# Patient Record
Sex: Female | Born: 1950
Health system: Southern US, Community
[De-identification: ages and names within clinical notes are randomized; demographics above are authoritative.]

## PROBLEM LIST (undated history)

## (undated) DIAGNOSIS — E785 Hyperlipidemia, unspecified: Secondary | ICD-10-CM

## (undated) DIAGNOSIS — L740 Miliaria rubra: Secondary | ICD-10-CM

## (undated) HISTORY — DX: Hyperlipidemia, unspecified: E78.5

## (undated) HISTORY — PX: NO PAST SURGERIES: SHX2092

## (undated) HISTORY — DX: Miliaria rubra: L74.0

---

## 1999-10-25 ENCOUNTER — Emergency Department (HOSPITAL_COMMUNITY): Admission: EM | Admit: 1999-10-25 | Discharge: 1999-10-25 | Payer: Self-pay | Admitting: Emergency Medicine

## 1999-10-27 ENCOUNTER — Emergency Department (HOSPITAL_COMMUNITY): Admission: EM | Admit: 1999-10-27 | Discharge: 1999-10-27 | Payer: Self-pay | Admitting: Emergency Medicine

## 1999-11-15 ENCOUNTER — Encounter: Payer: Self-pay | Admitting: Family Medicine

## 1999-11-15 ENCOUNTER — Ambulatory Visit (HOSPITAL_COMMUNITY): Admission: RE | Admit: 1999-11-15 | Discharge: 1999-11-15 | Payer: Self-pay | Admitting: Family Medicine

## 2010-04-26 ENCOUNTER — Emergency Department (HOSPITAL_COMMUNITY): Admission: EM | Admit: 2010-04-26 | Discharge: 2010-04-26 | Payer: Self-pay | Admitting: Emergency Medicine

## 2010-10-10 LAB — URINALYSIS, ROUTINE W REFLEX MICROSCOPIC
Bilirubin Urine: NEGATIVE
Nitrite: NEGATIVE
Specific Gravity, Urine: 1.007 (ref 1.005–1.030)
pH: 6.5 (ref 5.0–8.0)

## 2010-10-10 LAB — DIFFERENTIAL
Basophils Absolute: 0 10*3/uL (ref 0.0–0.1)
Basophils Relative: 0 % (ref 0–1)
Eosinophils Absolute: 0 10*3/uL (ref 0.0–0.7)
Eosinophils Relative: 0 % (ref 0–5)
Lymphocytes Relative: 18 % (ref 12–46)
Lymphs Abs: 1 10*3/uL (ref 0.7–4.0)
Monocytes Absolute: 0.3 10*3/uL (ref 0.1–1.0)
Monocytes Relative: 4 % (ref 3–12)
Neutro Abs: 4.5 10*3/uL (ref 1.7–7.7)
Neutrophils Relative %: 77 % (ref 43–77)

## 2010-10-10 LAB — COMPREHENSIVE METABOLIC PANEL
ALT: 22 U/L (ref 0–35)
AST: 23 U/L (ref 0–37)
Albumin: 3.5 g/dL (ref 3.5–5.2)
Alkaline Phosphatase: 53 U/L (ref 39–117)
BUN: 11 mg/dL (ref 6–23)
CO2: 27 mEq/L (ref 19–32)
Calcium: 8.3 mg/dL — ABNORMAL LOW (ref 8.4–10.5)
Chloride: 106 mEq/L (ref 96–112)
Creatinine, Ser: 0.57 mg/dL (ref 0.4–1.2)
GFR calc Af Amer: >60 mL/min (ref >60)
GFR calc non Af Amer: >60 mL/min (ref >60)
Glucose, Bld: 94 mg/dL (ref 70–99)
Potassium: 3.9 mEq/L (ref 3.5–5.1)
Sodium: 137 mEq/L (ref 135–145)
Total Bilirubin: 0.8 mg/dL (ref 0.3–1.2)
Total Protein: 6.5 g/dL (ref 6.0–8.3)

## 2010-10-10 LAB — CBC
HCT: 32.2 % — ABNORMAL LOW (ref 36.0–46.0)
Hemoglobin: 10.9 g/dL — ABNORMAL LOW (ref 12.0–15.0)
MCH: 28.5 pg (ref 26.0–34.0)
MCHC: 34 g/dL (ref 30.0–36.0)
MCV: 83.7 fL (ref 78.0–100.0)
Platelets: 194 10*3/uL (ref 150–400)
RBC: 3.84 MIL/uL — ABNORMAL LOW (ref 3.87–5.11)
RDW: 13.4 % (ref 11.5–15.5)
WBC: 5.8 10*3/uL (ref 4.0–10.5)

## 2010-10-10 LAB — URINE CULTURE: Culture  Setup Time: 201109301026

## 2010-10-10 LAB — HEMOCCULT GUIAC POC 1CARD (OFFICE): Fecal Occult Bld: NEGATIVE

## 2010-10-10 LAB — URINE MICROSCOPIC-ADD ON

## 2010-10-10 LAB — LIPASE, BLOOD: Lipase: 29 U/L (ref 11–59)

## 2013-11-11 ENCOUNTER — Ambulatory Visit (INDEPENDENT_AMBULATORY_CARE_PROVIDER_SITE_OTHER): Payer: BC Managed Care – PPO | Admitting: Family Medicine

## 2013-11-11 VITALS — BP 160/88 | HR 92 | Temp 98.6°F | Resp 16 | Ht <= 58 in | Wt 125.0 lb

## 2013-11-11 DIAGNOSIS — Z0000 Encounter for general adult medical examination without abnormal findings: Secondary | ICD-10-CM

## 2013-11-11 DIAGNOSIS — Z1239 Encounter for other screening for malignant neoplasm of breast: Secondary | ICD-10-CM

## 2013-11-11 DIAGNOSIS — Z1211 Encounter for screening for malignant neoplasm of colon: Secondary | ICD-10-CM

## 2013-11-11 DIAGNOSIS — Z13220 Encounter for screening for lipoid disorders: Secondary | ICD-10-CM

## 2013-11-11 DIAGNOSIS — Z124 Encounter for screening for malignant neoplasm of cervix: Secondary | ICD-10-CM

## 2013-11-11 DIAGNOSIS — Z23 Encounter for immunization: Secondary | ICD-10-CM

## 2013-11-11 LAB — CBC
HCT: 38.4 % (ref 36.0–46.0)
Hemoglobin: 12.8 g/dL (ref 12.0–15.0)
MCH: 26.7 pg (ref 26.0–34.0)
MCHC: 33.3 g/dL (ref 30.0–36.0)
MCV: 80 fL (ref 78.0–100.0)
PLATELETS: 283 10*3/uL (ref 150–400)
RBC: 4.8 MIL/uL (ref 3.87–5.11)
RDW: 14.4 % (ref 11.5–15.5)
WBC: 10.7 10*3/uL — ABNORMAL HIGH (ref 4.0–10.5)

## 2013-11-11 LAB — COMPREHENSIVE METABOLIC PANEL
ALK PHOS: 95 U/L (ref 39–117)
ALT: 26 U/L (ref 0–35)
AST: 25 U/L (ref 0–37)
Albumin: 4.2 g/dL (ref 3.5–5.2)
BILIRUBIN TOTAL: 0.4 mg/dL (ref 0.2–1.2)
BUN: 10 mg/dL (ref 6–23)
CO2: 28 meq/L (ref 19–32)
CREATININE: 0.63 mg/dL (ref 0.50–1.10)
Calcium: 9.4 mg/dL (ref 8.4–10.5)
Chloride: 102 mEq/L (ref 96–112)
Glucose, Bld: 85 mg/dL (ref 70–99)
Potassium: 4.4 mEq/L (ref 3.5–5.3)
Sodium: 141 mEq/L (ref 135–145)
Total Protein: 7.2 g/dL (ref 6.0–8.3)

## 2013-11-11 LAB — LIPID PANEL
CHOL/HDL RATIO: 6.4 ratio
Cholesterol: 268 mg/dL — ABNORMAL HIGH (ref 0–200)
HDL: 42 mg/dL (ref >39)
LDL CALC: 175 mg/dL — AB (ref 0–99)
TRIGLYCERIDES: 256 mg/dL — AB (ref <150)
VLDL: 51 mg/dL — ABNORMAL HIGH (ref 0–40)

## 2013-11-11 LAB — TSH: TSH: 1.504 u[IU]/mL (ref 0.350–4.500)

## 2013-11-11 LAB — IFOBT (OCCULT BLOOD): IFOBT: NEGATIVE

## 2013-11-11 NOTE — Progress Notes (Signed)
Subjective:    Patient ID: Shannon [name], female    DOB: 05-Nov-1950, 63 y.o.   MRN: [id number]  HPI Patient presents today for a CPE. She is accompanied by her husband. They are from [place] and have lived in the [place] since 1981. The patient does not have a PCP and has not been seen for any medical problems in many years.   Last CPE- unknown Last Mammo- 2001 Last PAP- never  Colonoscopy- never Flu- not recently  PMH- patient denies, takes no medications PSH- none FH/SH- Patient married for 16 years and has 4 grown sons and 2 grandchildren. Her father is alive and well, aged 49. Her mother is deceased, patient unsure of age or cause. The patient is retired. She does not smoke, drink or use illicit drugs.  The patient rides an exercise bike daily and does yard work.  Review of Systems  Constitutional: Negative for fever, chills, appetite change and fatigue.  HENT: Negative for congestion, dental problem, ear pain, hearing loss, rhinorrhea and sore throat.   Eyes: Negative for pain and visual disturbance.  Respiratory: Negative for cough, chest tightness and shortness of breath.   Cardiovascular: Negative for chest pain, palpitations and leg swelling.  Gastrointestinal: Negative for nausea, vomiting, abdominal pain, diarrhea and constipation.  Genitourinary: Negative for dysuria, frequency, vaginal bleeding, vaginal discharge, difficulty urinating, vaginal pain and dyspareunia.  Musculoskeletal: Negative for back pain, neck pain and neck stiffness.  Neurological: Negative for dizziness, light-headedness and headaches.  Psychiatric/Behavioral: Negative for sleep disturbance. The patient is not nervous/anxious.        Objective:   Physical Exam  Vitals reviewed. Constitutional: She is oriented to person, place, and time. She appears well-developed and well-nourished. No distress.  HENT:  Head: Normocephalic and atraumatic.  Right Ear: Tympanic membrane, external ear and ear canal  normal.  Left Ear: Tympanic membrane, external ear and ear canal normal.  Nose: Nose normal.  Mouth/Throat: Uvula is midline, oropharynx is clear and moist and mucous membranes are normal. No oropharyngeal exudate.  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 These cover approximately 10 percent of her corneas. She reports these are common in her country and that she has had them for many years. She denies visual disturbance, difficulty with vision. ? Corneal scarring.  Eyes: Conjunctivae and EOM are normal. Pupils are equal, round, and reactive to light. Right eye exhibits no discharge. Left eye exhibits no discharge.    Neck: Normal range of motion. Neck supple. No thyromegaly present.  Cardiovascular: Normal rate, regular rhythm, normal heart sounds and intact distal pulses.   Pulmonary/Chest: Effort normal and breath sounds normal.  Abdominal: Soft. Bowel sounds are normal.  Genitourinary: Vagina normal and uterus normal. No breast swelling, tenderness, discharge or bleeding. Pelvic exam was performed with patient prone. There is no rash, tenderness, lesion or injury on the right labia. There is no rash, tenderness, lesion or injury on the left labia. No vaginal discharge found.  Musculoskeletal: Normal range of motion. She exhibits no edema and no tenderness.  Lymphadenopathy:    She has no cervical adenopathy.  Neurological: She is alert and oriented to person, place, and time. She has normal reflexes.  Skin: Skin is warm and dry. She is not diaphoretic.  Psychiatric: She has a normal mood and affect. Her behavior is normal. Judgment and thought content normal.       Assessment & Plan:  1. Breast cancer screening - MM Digital Screening; Future  2.  Cervical cancer screening - Pap IG and HPV (high risk) DNA detection  3. Colon cancer screening -Recommended colonoscopy, patient not interested at this time. Will think about it. - IFOBT POC (occult bld, rslt in  office)  4. Lipid screening - Lipid panel  5. Routine general medical examination at a health care facility - CBC - Comprehensive metabolic panel - TSH  Patient left prior to receiving Tdap. Will place a future order and ask her to return when she is notified of her lab results.  Provided written and verbal instructions regarding recommendations for opthalmologic and dental care. Encouraged patient to establish regular care at the [facility] appointment center.  Elby Beck, FNP-BC  [facility]  11/11/2013 11:35 AM   Patient discussed with Ms. Carlean Purl, NP and agree with the above.    Christell Faith, MHS, PA-C [facility], [place] [address] [place] 11/11/2013 2:08 PM

## 2013-11-11 NOTE — Patient Instructions (Addendum)
Please call [facility] to schedule your mammogram- [phone] Recommendations- Please see the eye doctor every year Please see the dentist every 6 months.  Consider having a colonoscopy to have your colon screened for colon cancer.   If you would like to establish care at the appointment center next door, call [phone] and ask to be transferred to the appointment center.

## 2013-11-15 LAB — PAP IG AND HPV HIGH-RISK: HPV DNA High Risk: NOT DETECTED

## 2013-11-17 ENCOUNTER — Encounter: Payer: Self-pay | Admitting: Family Medicine

## 2013-11-25 ENCOUNTER — Ambulatory Visit
Admission: RE | Admit: 2013-11-25 | Discharge: 2013-11-25 | Disposition: A | Discharge status: Home / Self Care | Payer: BC Managed Care – PPO | Source: Ambulatory Visit | Attending: Family Medicine | Admitting: Family Medicine

## 2013-11-25 DIAGNOSIS — Z1239 Encounter for other screening for malignant neoplasm of breast: Secondary | ICD-10-CM

## 2014-05-12 ENCOUNTER — Ambulatory Visit (INDEPENDENT_AMBULATORY_CARE_PROVIDER_SITE_OTHER): Payer: BC Managed Care – PPO | Admitting: Family Medicine

## 2014-05-12 VITALS — BP 142/78 | HR 94 | Temp 98.3°F | Resp 16 | Ht <= 58 in | Wt 119.0 lb

## 2014-05-12 DIAGNOSIS — Z23 Encounter for immunization: Secondary | ICD-10-CM

## 2014-05-12 DIAGNOSIS — L259 Unspecified contact dermatitis, unspecified cause: Secondary | ICD-10-CM

## 2014-05-12 MED ORDER — CLOBETASOL PROPIONATE 0.05 % EX CREA: 1.0000 "application " | TOPICAL_CREAM | Freq: Two times a day (BID) | CUTANEOUS | Status: DC

## 2014-05-12 NOTE — Patient Instructions (Addendum)
Use a small amount of cream twice a day for up to 14 days If your rash gets worse, come back in to see [place]. If it is not better in 7-10 days, come back in to see [place]. Contact Dermatitis Contact dermatitis is a reaction to certain substances that touch the skin. Contact dermatitis can be either irritant contact dermatitis or allergic contact dermatitis. Irritant contact dermatitis does not require previous exposure to the substance for a reaction to occur.Allergic contact dermatitis only occurs if you have been exposed to the substance before. Upon a repeat exposure, your body reacts to the substance.  CAUSES  Many substances can cause contact dermatitis. Irritant dermatitis is most commonly caused by repeated exposure to mildly irritating substances, such as:  Makeup.  Soaps.  Detergents.  Bleaches.  Acids.  Metal salts, such as nickel. Allergic contact dermatitis is most commonly caused by exposure to:  Poisonous plants.  Chemicals (deodorants, shampoos).  Jewelry.  Latex.  Neomycin in triple antibiotic cream.  Preservatives in products, including clothing. SYMPTOMS  The area of skin that is exposed may develop:  Dryness or flaking.  Redness.  Cracks.  Itching.  Pain or a burning sensation.  Blisters. With allergic contact dermatitis, there may also be swelling in areas such as the eyelids, mouth, or genitals.  DIAGNOSIS  Your caregiver can usually tell what the problem is by doing a physical exam. In cases where the cause is uncertain and an allergic contact dermatitis is suspected, a patch skin test may be performed to help determine the cause of your dermatitis. TREATMENT Treatment includes protecting the skin from further contact with the irritating substance by avoiding that substance if possible. Barrier creams, powders, and gloves may be helpful. Your caregiver may also recommend:  Steroid creams or ointments applied 2 times daily. For best results, soak the  rash area in cool water for 20 minutes. Then apply the medicine. Cover the area with a plastic wrap. You can store the steroid cream in the refrigerator for a "chilly" effect on your rash. That may decrease itching. Oral steroid medicines may be needed in more severe cases.  Antibiotics or antibacterial ointments if a skin infection is present.  Antihistamine lotion or an antihistamine taken by mouth to ease itching.  Lubricants to keep moisture in your skin.  Burow's solution to reduce redness and soreness or to dry a weeping rash. Mix one packet or tablet of solution in 2 cups cool water. Dip a clean washcloth in the mixture, wring it out a bit, and put it on the affected area. Leave the cloth in place for 30 minutes. Do this as often as possible throughout the day.  Taking several cornstarch or baking soda baths daily if the area is too large to cover with a washcloth. Harsh chemicals, such as alkalis or acids, can cause skin damage that is like a burn. You should flush your skin for 15 to 20 minutes with cold water after such an exposure. You should also seek immediate medical care after exposure. Bandages (dressings), antibiotics, and pain medicine may be needed for severely irritated skin.  HOME CARE INSTRUCTIONS  Avoid the substance that caused your reaction.  Keep the area of skin that is affected away from hot water, soap, sunlight, chemicals, acidic substances, or anything else that would irritate your skin.  Do not scratch the rash. Scratching may cause the rash to become infected.  You may take cool baths to help stop the itching.  Only take  over-the-counter or prescription medicines as directed by your caregiver.  See your caregiver for follow-up care as directed to make sure your skin is healing properly. SEEK MEDICAL CARE IF:   Your condition is not better after 3 days of treatment.  You seem to be getting worse.  You see signs of infection such as swelling, tenderness,  redness, soreness, or warmth in the affected area.  You have any problems related to your medicines. Document Released: 07/11/2000 Document Revised: 10/06/2011 Document Reviewed: 12/17/2010 [facility] Patient Information 2015 [place], [place]. This information is not intended to replace advice given to you by your health care provider. Make sure you discuss any questions you have with your health care provider.  Tdap Vaccine (Tetanus, Diphtheria, Pertussis): What You Need to Know 1. Why get vaccinated? Tetanus, diphtheria and pertussis can be very serious diseases, even for adolescents and adults. Tdap vaccine can protect [place] from these diseases. TETANUS (Lockjaw) causes painful muscle tightening and stiffness, usually all over the body.  It can lead to tightening of muscles in the head and neck so you can't open your mouth, swallow, or sometimes even breathe. Tetanus kills about 1 out of 5 people who are infected. DIPHTHERIA can cause a thick coating to form in the back of the throat.  It can lead to breathing problems, paralysis, heart failure, and death. PERTUSSIS (Whooping Cough) causes severe coughing spells, which can cause difficulty breathing, vomiting and disturbed sleep.  It can also lead to weight loss, incontinence, and rib fractures. Up to 2 in 100 adolescents and 5 in 100 adults with pertussis are hospitalized or have complications, which could include pneumonia or death. These diseases are caused by bacteria. Diphtheria and pertussis are spread from person to person through coughing or sneezing. Tetanus enters the body through cuts, scratches, or wounds. Before vaccines, the [place] saw as many as 200,000 cases a year of diphtheria and pertussis, and hundreds of cases of tetanus. Since vaccination began, tetanus and diphtheria have dropped by about 99% and pertussis by about 80%. 2. Tdap vaccine Tdap vaccine can protect adolescents and adults from tetanus, diphtheria, and  pertussis. One dose of Tdap is routinely given at age 57 or 64. People who did not get Tdap at that age should get it as soon as possible. Tdap is especially important for health care professionals and anyone having close contact with a baby younger than 12 months. Pregnant women should get a dose of Tdap during every pregnancy, to protect the newborn from pertussis. Infants are most at risk for severe, life-threatening complications from pertussis. A similar vaccine, called Td, protects from tetanus and diphtheria, but not pertussis. A Td booster should be given every 10 years. Tdap may be given as one of these boosters if you have not already gotten a dose. Tdap may also be given after a severe cut or burn to prevent tetanus infection. Your doctor can give you more information. Tdap may safely be given at the same time as other vaccines. 3. Some people should not get this vaccine  If you ever had a life-threatening allergic reaction after a dose of any tetanus, diphtheria, or pertussis containing vaccine, OR if you have a severe allergy to any part of this vaccine, you should not get Tdap. Tell your doctor if you have any severe allergies.  If you had a coma, or long or multiple seizures within 7 days after a childhood dose of DTP or DTaP, you should not get Tdap, unless a  cause other than the vaccine was found. You can still get Td.  Talk to your doctor if you:  have epilepsy or another nervous system problem,  had severe pain or swelling after any vaccine containing diphtheria, tetanus or pertussis,  ever had Guillain-Barr Syndrome (GBS),  aren't feeling well on the day the shot is scheduled. 4. Risks of a vaccine reaction With any medicine, including vaccines, there is a chance of side effects. These are usually mild and go away on their own, but serious reactions are also possible. Brief fainting spells can follow a vaccination, leading to injuries from falling. Sitting or lying down  for about 15 minutes can help prevent these. Tell your doctor if you feel dizzy or light-headed, or have vision changes or ringing in the ears. Mild problems following Tdap (Did not interfere with activities)  Pain where the shot was given (about 3 in 4 adolescents or 2 in 3 adults)  Redness or swelling where the shot was given (about 1 person in 5)  Mild fever of at least 100.57F (up to about 1 in 25 adolescents or 1 in 100 adults)  Headache (about 3 or 4 people in 10)  Tiredness (about 1 person in 3 or 4)  Nausea, vomiting, diarrhea, stomach ache (up to 1 in 4 adolescents or 1 in 10 adults)  Chills, body aches, sore joints, rash, swollen glands (uncommon) Moderate problems following Tdap (Interfered with activities, but did not require medical attention)  Pain where the shot was given (about 1 in 5 adolescents or 1 in 100 adults)  Redness or swelling where the shot was given (up to about 1 in 16 adolescents or 1 in 25 adults)  Fever over 102F (about 1 in 100 adolescents or 1 in 250 adults)  Headache (about 3 in 20 adolescents or 1 in 10 adults)  Nausea, vomiting, diarrhea, stomach ache (up to 1 or 3 people in 100)  Swelling of the entire arm where the shot was given (up to about 3 in 100). Severe problems following Tdap (Unable to perform usual activities; required medical attention)  Swelling, severe pain, bleeding and redness in the arm where the shot was given (rare). A severe allergic reaction could occur after any vaccine (estimated less than 1 in a million doses). 5. What if there is a serious reaction? What should I look for?  Look for anything that concerns you, such as signs of a severe allergic reaction, very high fever, or behavior changes. Signs of a severe allergic reaction can include hives, swelling of the face and throat, difficulty breathing, a fast heartbeat, dizziness, and weakness. These would start a few minutes to a few hours after the  vaccination. What should I do?  If you think it is a severe allergic reaction or other emergency that can't wait, call 9-1-1 or get the person to the nearest hospital. Otherwise, call your doctor.  Afterward, the reaction should be reported to the "Vaccine Adverse Event Reporting System" (VAERS). Your doctor might file this report, or you can do it yourself through the VAERS web site at www.vaers.SamedayNews.es, or by calling 928-679-5251. VAERS is only for reporting reactions. They do not give medical advice.  6. The National Vaccine Injury Compensation Program The [employer] Vaccine Injury Compensation Program (VICP) is a federal program that was created to compensate people who may have been injured by certain vaccines. Persons who believe they may have been injured by a vaccine can learn about the program and about filing a  claim by calling 720-157-5613 or visiting the [facility] website at GoldCloset.com.ee. 7. How can I learn more?  Ask your doctor.  Call your local or state health department.  Contact the Centers for Disease Control and Prevention (CDC):  Call (248)336-7758 or visit CDC's website at http://hunter.com/. CDC Tdap Vaccine VIS (12/04/11) Document Released: 01/13/2012 Document Revised: 11/28/2013 Document Reviewed: 10/26/2013 [employer] Patient Information 2015 [place], [place]. This information is not intended to replace advice given to you by your health care provider. Make sure you discuss any questions you have with your health care provider. Influenza Vaccine (Flu Vaccine, Inactivated or Recombinant) 2014-2015: What You Need to Know 1. Why get vaccinated? Influenza ("flu") is a contagious disease that spreads around the [place] every winter, usually between October and May. Flu is caused by influenza viruses, and is spread mainly by coughing, sneezing, and close contact. Anyone can get flu, but the risk of getting flu is highest among children. Symptoms come  on suddenly and may last several days. They can include:  fever/chills  sore throat  muscle aches  fatigue  cough  headache  runny or stuffy nose Flu can make some people much sicker than others. These people include young children, people 40 and older, pregnant women, and people with certain health conditions-such as heart, lung or kidney disease, nervous system disorders, or a weakened immune system. Flu vaccination is especially important for these people, and anyone in close contact with them. Flu can also lead to pneumonia, and make existing medical conditions worse. It can cause diarrhea and seizures in children. Each year thousands of people in the [place] die from flu, and many more are hospitalized. Flu vaccine is the best protection against flu and its complications. Flu vaccine also helps prevent spreading flu from person to person. 2. Inactivated and recombinant flu vaccines You are getting an injectable flu vaccine, which is either an "inactivated" or "recombinant" vaccine. These vaccines do not contain any live influenza virus. They are given by injection with a needle, and often called the "flu shot."  A different live, attenuated (weakened) influenza vaccine is sprayed into the nostrils. This vaccine is described in a separate Vaccine Information Statement. Flu vaccination is recommended every year. Some children 6 months through 9 years of age might need two doses during one year. Flu viruses are always changing. Each year's flu vaccine is made to protect against 3 or 4 viruses that are likely to cause disease that year. Flu vaccine cannot prevent all cases of flu, but it is the best defense against the disease.  It takes about 2 weeks for protection to develop after the vaccination, and protection lasts several months to a year. Some illnesses that are not caused by influenza virus are often mistaken for flu. Flu vaccine will not prevent these illnesses. It can only  prevent influenza. Some inactivated flu vaccine contains a very small amount of a mercury-based preservative called thimerosal. Studies have shown that thimerosal in vaccines is not harmful, but flu vaccines that do not contain a preservative are available. 3. Some people should not get this vaccine Tell the person who gives you the vaccine:  If you have any severe, life-threatening allergies. If you ever had a life-threatening allergic reaction after a dose of flu vaccine, or have a severe allergy to any part of this vaccine, including (for example) an allergy to gelatin, antibiotics, or eggs, you may be advised not to get vaccinated. Most, but not all, types of flu vaccine  contain a small amount of egg protein.  If you ever had Guillain-Barr Syndrome (a severe paralyzing illness, also called GBS). Some people with a history of GBS should not get this vaccine. This should be discussed with your doctor.  If you are not feeling well. It is usually okay to get flu vaccine when you have a mild illness, but you might be advised to wait until you feel better. You should come back when you are better. 4. Risks of a vaccine reaction With a vaccine, like any medicine, there is a chance of side effects. These are usually mild and go away on their own. Problems that could happen after any vaccine:  Brief fainting spells can happen after any medical procedure, including vaccination. Sitting or lying down for about 15 minutes can help prevent fainting, and injuries caused by a fall. Tell your doctor if you feel dizzy, or have vision changes or ringing in the ears.  Severe shoulder pain and reduced range of motion in the arm where a shot was given can happen, very rarely, after a vaccination.  Severe allergic reactions from a vaccine are very rare, estimated at less than 1 in a million doses. If one were to occur, it would usually be within a few minutes to a few hours after the vaccination. Mild problems  following inactivated flu vaccine:  soreness, redness, or swelling where the shot was given  hoarseness  sore, red or itchy eyes  cough  fever  aches  headache  itching  fatigue If these problems occur, they usually begin soon after the shot and last 1 or 2 days. Moderate problems following inactivated flu vaccine:  Young children who get inactivated flu vaccine and pneumococcal vaccine (PCV13) at the same time may be at increased risk for seizures caused by fever. Ask your doctor for more information. Tell your doctor if a child who is getting flu vaccine has ever had a seizure. Inactivated flu vaccine does not contain live flu virus, so you cannot get the flu from this vaccine. As with any medicine, there is a very remote chance of a vaccine causing a serious injury or death. The safety of vaccines is always being monitored. For more information, visit: http://www.aguilar.org/ 5. What if there is a serious reaction? What should I look for?  Look for anything that concerns you, such as signs of a severe allergic reaction, very high fever, or behavior changes. Signs of a severe allergic reaction can include hives, swelling of the face and throat, difficulty breathing, a fast heartbeat, dizziness, and weakness. These would start a few minutes to a few hours after the vaccination. What should I do?  If you think it is a severe allergic reaction or other emergency that can't wait, call 9-1-1 and get the person to the nearest hospital. Otherwise, call your doctor.  Afterward, the reaction should be reported to the Vaccine Adverse Event Reporting System (VAERS). Your doctor should file this report, or you can do it yourself through the VAERS web site at www.vaers.SamedayNews.es, or by calling 515-001-1161. VAERS does not give medical advice. 6. The National Vaccine Injury Compensation Program The [employer] Vaccine Injury Compensation Program (VICP) is a federal program that was created  to compensate people who may have been injured by certain vaccines. Persons who believe they may have been injured by a vaccine can learn about the program and about filing a claim by calling (437)446-7382 or visiting the [place] website at GoldCloset.com.ee. There is a time limit  to file a claim for compensation. 7. How can I learn more?  Ask your health care provider.  Call your local or state health department.  Contact the Centers for Disease Control and Prevention (CDC):  Call (248)477-4064 (1-800-CDC-INFO) or  Visit CDC's website at https://gibson.com/ CDC Vaccine Information Statement (Interim) Inactivated Influenza Vaccine (03/15/2013) Document Released: 05/08/2006 Document Revised: 11/28/2013 Document Reviewed: 07/01/2013 [facility] Patient Information 2015 [place]. This information is not intended to replace advice given to you by your health care provider. Make sure you discuss any questions you have with your health care provider.

## 2014-05-12 NOTE — Progress Notes (Signed)
   Subjective:    Patient ID: Shannon [name], female    DOB: Oct 30, 1950, 63 y.o.   MRN: [id number]  HPI  This is a very pleasant 63 yo female who is accompanied by her husband. She presents today with a rash between her fingers. She was removing her tomato plants from her garden about 2 weeks ago when she became irritated between her 2-3rd, 3-4th fingers on both hands. The areas became red and itchy initially. She applied some OTC cream for itching (not sure what this was) with relief of itching. She has some occasional clear drainage. The rash has stayed the same over the last week.    Review of Systems No fever or chills, no pain.     Objective:   Physical Exam  Vitals reviewed. Constitutional: She is oriented to person, place, and time. She appears well-developed and well-nourished.  HENT:  Head: Normocephalic and atraumatic.  Neck: Normal range of motion. Neck supple.  Cardiovascular: Normal rate.   Pulmonary/Chest: Effort normal.  Musculoskeletal: Normal range of motion.  Neurological: She is alert and oriented to person, place, and time.  Skin: Skin is warm and dry. Rash noted.  Area between fingers (2-3, 3-4) bilaterally with peeling, flaking, redness. Evidence of ointment. No purulence. No streaking, no extension to dorsum or palm.       Assessment & Plan:  1. Contact dermatitis -Provided written and verbal information regarding diagnosis and treatment. -patient instructions- Use a small amount of cream twice a day for up to 14 days If your rash gets worse, come back in to see [place]. If it is not better in 7-10 days, come back in to see [place]. - clobetasol cream (TEMOVATE) 0.05 %; Apply 1 application topically 2 (two) times daily.  Dispense: 30 g; Refill: 0  2. Flu vaccine need - Flu Vaccine QUAD 36+ mos IM  3. Need for Tdap vaccination - Tdap vaccine greater than or equal to 7yo IM   Elby Beck, FNP-BC  [facility] [place]  [facility]  05/12/2014 10:03 AM

## 2015-01-19 ENCOUNTER — Other Ambulatory Visit: Payer: Self-pay | Admitting: Family Medicine

## 2015-01-19 DIAGNOSIS — Z1231 Encounter for screening mammogram for malignant neoplasm of breast: Secondary | ICD-10-CM

## 2015-02-02 ENCOUNTER — Ambulatory Visit (HOSPITAL_COMMUNITY)
Admission: RE | Admit: 2015-02-02 | Discharge: 2015-02-02 | Disposition: A | Discharge status: Home / Self Care | Payer: Commercial Managed Care - HMO | Source: Ambulatory Visit | Attending: Family Medicine | Admitting: Family Medicine

## 2015-02-02 DIAGNOSIS — Z1231 Encounter for screening mammogram for malignant neoplasm of breast: Secondary | ICD-10-CM | POA: Insufficient documentation

## 2015-02-05 ENCOUNTER — Other Ambulatory Visit: Payer: Self-pay | Admitting: Family Medicine

## 2015-02-05 DIAGNOSIS — R928 Other abnormal and inconclusive findings on diagnostic imaging of breast: Secondary | ICD-10-CM

## 2015-03-09 ENCOUNTER — Ambulatory Visit
Admission: RE | Admit: 2015-03-09 | Discharge: 2015-03-09 | Disposition: A | Discharge status: Home / Self Care | Payer: Commercial Managed Care - HMO | Source: Ambulatory Visit | Attending: Family Medicine | Admitting: Family Medicine

## 2015-03-09 DIAGNOSIS — R921 Mammographic calcification found on diagnostic imaging of breast: Secondary | ICD-10-CM | POA: Diagnosis not present

## 2015-03-09 DIAGNOSIS — R928 Other abnormal and inconclusive findings on diagnostic imaging of breast: Secondary | ICD-10-CM

## 2015-11-06 ENCOUNTER — Encounter: Payer: Self-pay | Admitting: Family Medicine

## 2015-11-06 ENCOUNTER — Ambulatory Visit (INDEPENDENT_AMBULATORY_CARE_PROVIDER_SITE_OTHER): Payer: Medicare HMO | Admitting: Family Medicine

## 2015-11-06 VITALS — BP 125/79 | HR 85 | Temp 98.3°F | Resp 16 | Ht <= 58 in | Wt 121.0 lb

## 2015-11-06 DIAGNOSIS — Z23 Encounter for immunization: Secondary | ICD-10-CM | POA: Diagnosis not present

## 2015-11-06 DIAGNOSIS — Z1389 Encounter for screening for other disorder: Secondary | ICD-10-CM

## 2015-11-06 DIAGNOSIS — Z1211 Encounter for screening for malignant neoplasm of colon: Secondary | ICD-10-CM | POA: Diagnosis not present

## 2015-11-06 DIAGNOSIS — Z0000 Encounter for general adult medical examination without abnormal findings: Secondary | ICD-10-CM

## 2015-11-06 DIAGNOSIS — Z13220 Encounter for screening for lipoid disorders: Secondary | ICD-10-CM

## 2015-11-06 DIAGNOSIS — Z130 Encounter for screening for diseases of the blood and blood-forming organs and certain disorders involving the immune mechanism: Secondary | ICD-10-CM

## 2015-11-06 DIAGNOSIS — Z139 Encounter for screening, unspecified: Secondary | ICD-10-CM

## 2015-11-06 LAB — CBC
HEMATOCRIT: 37.4 % (ref 35.0–45.0)
Hemoglobin: 12.4 g/dL (ref 11.7–15.5)
MCH: 27.2 pg (ref 27.0–33.0)
MCHC: 33.2 g/dL (ref 32.0–36.0)
MCV: 82 fL (ref 80.0–100.0)
MPV: 10.9 fL (ref 7.5–12.5)
PLATELETS: 255 10*3/uL (ref 140–400)
RBC: 4.56 MIL/uL (ref 3.80–5.10)
RDW: 14.2 % (ref 11.0–15.0)
WBC: 9.2 10*3/uL (ref 3.8–10.8)

## 2015-11-06 LAB — COMPLETE METABOLIC PANEL WITH GFR
ALK PHOS: 62 U/L (ref 33–130)
ALT: 13 U/L (ref 6–29)
AST: 22 U/L (ref 10–35)
Albumin: 4.2 g/dL (ref 3.6–5.1)
BILIRUBIN TOTAL: 0.5 mg/dL (ref 0.2–1.2)
BUN: 8 mg/dL (ref 7–25)
CO2: 26 mmol/L (ref 20–31)
Calcium: 9.2 mg/dL (ref 8.6–10.4)
Chloride: 103 mmol/L (ref 98–110)
Creat: 0.6 mg/dL (ref 0.50–0.99)
GFR, Est Non African American: >89 mL/min (ref >=60)
Glucose, Bld: 85 mg/dL (ref 65–99)
Potassium: 4 mmol/L (ref 3.5–5.3)
Sodium: 140 mmol/L (ref 135–146)
TOTAL PROTEIN: 7.3 g/dL (ref 6.1–8.1)

## 2015-11-06 LAB — LIPID PANEL
CHOL/HDL RATIO: 5.3 ratio — AB (ref <=5.0)
CHOLESTEROL: 261 mg/dL — AB (ref 125–200)
HDL: 49 mg/dL (ref >=46)
LDL Cholesterol: 164 mg/dL — ABNORMAL HIGH (ref <130)
TRIGLYCERIDES: 241 mg/dL — AB (ref <150)
VLDL: 48 mg/dL — ABNORMAL HIGH (ref <30)

## 2015-11-06 NOTE — Patient Instructions (Addendum)
We will call you about an appointment for screening colonoscopy  Keep up the good work with your exercise and healthy food choices   Keeping You Healthy  Get These Tests  Blood Pressure- Have your blood pressure checked by your healthcare provider at least once a year.  Normal blood pressure is 120/80.  Weight- Have your body mass index (BMI) calculated to screen for obesity.  BMI is a measure of body fat based on height and weight.  You can calculate your own BMI at [employer]velB[employer].it  Cholesterol- Have your cholesterol checked every year.  Diabetes- Have your blood sugar checked every year if you have high blood pressure, high cholesterol, a family history of diabetes or if you are overweight.  Pap Test - Have a pap test every 1 to 5 years if you have been sexually active.  If you are older than 65 and recent pap tests have been normal you may not need additional pap tests.  In addition, if you have had a hysterectomy  for benign disease additional pap tests are not necessary.  Mammogram-Yearly mammograms are essential for early detection of breast cancer  Screening for Colon Cancer- Colonoscopy starting at age 66. Screening may begin sooner depending on your family history and other health conditions.  Follow up colonoscopy as directed by your Gastroenterologist.  Screening for Osteoporosis- Screening begins at age 66 with bone density scanning, sooner if you are at higher risk for developing Osteoporosis.  Get these medicines  Calcium with Vitamin D- Your body requires 1200-1500 mg of Calcium a day and [phone] IU of Vitamin D a day.  You can only absorb 500 mg of Calcium at a time therefore Calcium must be taken in 2 or 3 separate doses throughout the day.  Hormones- Hormone therapy has been associated with increased risk for certain cancers and heart disease.  Talk to your healthcare provider about if you need relief from menopausal symptoms.  Aspirin- Ask your healthcare  provider about taking Aspirin to prevent Heart Disease and Stroke.  Get these Immuniztions  Flu shot- Every fall  Pneumonia shot- Once after the age of 23; if you are younger ask your healthcare provider if you need a pneumonia shot.  Tetanus- Every ten years.  Zostavax- Once after the age of 43 to prevent shingles.  Take these steps  Don't smoke- Your healthcare provider can help you quit. For tips on how to quit, ask your healthcare provider or go to www.smokefree.gov or call 1-800 QUIT-NOW.  Be physically active- Exercise 5 days a week for a minimum of 30 minutes.  If you are not already physically active, start slow and gradually work up to 30 minutes of moderate physical activity.  Try walking, dancing, bike riding, swimming, etc.  Eat a healthy diet- Eat a variety of healthy foods such as fruits, vegetables, whole grains, low fat milk, low fat cheeses, yogurt, lean meats, chicken, fish, eggs, dried beans, tofu, etc.  For more information go to www.thenutritionsource.org  Dental visit- Brush and floss teeth twice daily; visit your dentist twice a year.  Eye exam- Visit your Optometrist or Ophthalmologist yearly.  Drink alcohol in moderation- Limit alcohol intake to one drink or less a day.  Never drink and drive.  Depression- Your emotional health is as important as your physical health.  If you're feeling down or losing interest in things you normally enjoy, please talk to your healthcare provider.  Seat Belts- can save your life; always wear one  Smoke/Carbon Monoxide  detectors- These detectors need to be installed on the appropriate level of your home.  Replace batteries at least once a year.  Violence- If anyone is threatening or hurting you, please tell your healthcare provider.  Living Will/ Health care power of attorney- Discuss with your healthcare provider and family.    IF you received an x-ray today, you will receive an invoice from [facility]. Please  contact [facility] at [phone] with questions or concerns regarding your invoice.   IF you received labwork today, you will receive an invoice from [employer]. Please contact [facility] at [phone] with questions or concerns regarding your invoice.   Our billing staff will not be able to assist you with questions regarding bills from these companies.  You will be contacted with the lab results as soon as they are available. The fastest way to get your results is to activate your My Chart account. Instructions are located on the last page of this paperwork. If you have not heard from [place] regarding the results in 2 weeks, please contact this office.

## 2015-11-06 NOTE — Progress Notes (Signed)
Subjective:    Patient ID: Shannon [name], female    DOB: [birth year], 65 y.o.   MRN: [id number]  HPI This is a pleasant 65 yo female who presents today for CPE. She is accompanied by her husband. She has 3 sons. She is retired.   Last CPE- several years ago Mammo- 03/09/15 Pap- 11/11/13- nml Colonoscopy- never, willing to schedule Tdap- 04/2014 Flu- annual Eye- not for several years Dental- not for several years Exercise- goes to gym 3x week, gardens  No past medical history on file. No past surgical history on file. No family history on file. Social History  Substance Use Topics  . Smoking status: Never Smoker   . Smokeless tobacco: None  . Alcohol Use: No    Review of Systems  Constitutional: Negative.   HENT: Negative.   Eyes: Negative.   Respiratory: Negative.   Cardiovascular: Negative.   Gastrointestinal: Negative.   Endocrine: Negative.   Genitourinary: Negative.   Musculoskeletal: Negative.   Skin: Negative.   Allergic/Immunologic: Negative.   Neurological: Negative.   Hematological: Negative.   Psychiatric/Behavioral: Negative.        Objective:   Physical Exam Physical Exam  Constitutional: She is oriented to person, place, and time. She appears well-developed and well-nourished. No distress.  HENT:  Head: Normocephalic and atraumatic.  Right Ear: External ear normal.  Left Ear: External ear normal.  Nose: Nose normal.  Mouth/Throat: Oropharynx is clear and moist. No oropharyngeal exudate.  Eyes: Conjunctivae are normal. Pupils are equal, round, and reactive to light.  Neck: Normal range of motion. Neck supple. No JVD present. No thyromegaly present.  Cardiovascular: Normal rate, regular rhythm, normal heart sounds and intact distal pulses.   Pulmonary/Chest: Effort normal and breath sounds normal. Right breast exhibits no inverted nipple, no mass, no nipple discharge, no skin change and no tenderness. Left breast exhibits no inverted nipple, no  mass, no nipple discharge, no skin change and no tenderness. Breasts are symmetrical.  Abdominal: Soft. Bowel sounds are normal. She exhibits no distension and no mass. There is no tenderness. There is no rebound and no guarding.  Musculoskeletal: Normal range of motion. She exhibits no edema or tenderness.  Lymphadenopathy:    She has no cervical adenopathy.  Neurological: She is alert and oriented to person, place, and time. She has normal reflexes.  Skin: Skin is warm and dry. She is not diaphoretic.  Psychiatric: She has a normal mood and affect. Her behavior is normal. Judgment and thought content normal.  Vitals reviewed. BP 125/79 mmHg  Pulse 85  Temp(Src) 98.3 F (36.8 C)  Resp 16  Ht 4\' 9"  (1.448 m)  Wt 121 lb (54.885 kg)  BMI 26.18 kg/m2 Wt Readings from Last 3 Encounters:  11/06/15 121 lb (54.885 kg)  05/12/14 119 lb (53.978 kg)  11/11/13 125 lb (56.7 kg)       Assessment & Plan:  1. Annual physical exam -- Discussed and encouraged healthy lifestyle choices- adequate sleep, regular exercise, stress management and healthy food choices.   2. Screening for deficiency anemia - CBC  3. Screening for nephropathy - COMPLETE METABOLIC PANEL WITH GFR  4. Screening for lipid disorders - Lipid panel  5. Screening for hematuria or proteinuria - POCT urinalysis dipstick  6. Special screening for malignant neoplasms, colon - Ambulatory referral to Gastroenterology  7. Need for vaccination with 13-polyvalent pneumococcal conjugate vaccine - Pneumococcal conjugate vaccine 13-valent IM   Clarene Reamer, FNP-BC  [facility]  [place] [facility]  11/07/2015 9:40 PM

## 2015-11-11 ENCOUNTER — Encounter: Payer: Self-pay | Admitting: Family Medicine

## 2016-02-08 ENCOUNTER — Ambulatory Visit (INDEPENDENT_AMBULATORY_CARE_PROVIDER_SITE_OTHER): Payer: Medicare HMO | Admitting: Physician Assistant

## 2016-02-08 VITALS — BP 124/80 | HR 98 | Temp 98.8°F | Resp 18 | Ht <= 58 in | Wt 121.0 lb

## 2016-02-08 DIAGNOSIS — L259 Unspecified contact dermatitis, unspecified cause: Secondary | ICD-10-CM | POA: Diagnosis not present

## 2016-02-08 MED ORDER — TRIAMCINOLONE ACETONIDE 0.5 % EX OINT: 1.0000 "application " | TOPICAL_OINTMENT | Freq: Two times a day (BID) | CUTANEOUS | Status: DC

## 2016-02-08 NOTE — Patient Instructions (Addendum)
Please apply the steroid topical twice daily for 2 weeks. If this doesn't improve the rash or if it gets worse please come back to see Shannon [name] for further evaluation.   Contact Dermatitis Dermatitis is redness, soreness, and swelling (inflammation) of the skin. Contact dermatitis is a reaction to certain substances that touch the skin. There are two types of contact dermatitis:   Irritant contact dermatitis. This type is caused by something that irritates your skin, such as dry hands from washing them too much. This type does not require previous exposure to the substance for a reaction to occur. This type is more common.  Allergic contact dermatitis. This type is caused by a substance that you are allergic to, such as a nickel allergy or poison ivy. This type only occurs if you have been exposed to the substance (allergen) before. Upon a repeat exposure, your body reacts to the substance. This type is less common. CAUSES  Many different substances can cause contact dermatitis. Irritant contact dermatitis is most commonly caused by exposure to:   Makeup.   Soaps.   Detergents.   Bleaches.   Acids.   Metal salts, such as nickel.  Allergic contact dermatitis is most commonly caused by exposure to:   Poisonous plants.   Chemicals.   Jewelry.   Latex.   Medicines.   Preservatives in products, such as clothing.  RISK FACTORS This condition is more likely to develop in:   People who have jobs that expose them to irritants or allergens.  People who have certain medical conditions, such as asthma or eczema.  SYMPTOMS  Symptoms of this condition may occur anywhere on your body where the irritant has touched you or is touched by you. Symptoms include:  Dryness or flaking.   Redness.   Cracks.   Itching.   Pain or a burning feeling.   Blisters.  Drainage of small amounts of blood or clear fluid from skin cracks. With allergic contact dermatitis, there may  also be swelling in areas such as the eyelids, mouth, or genitals.  DIAGNOSIS  This condition is diagnosed with a medical history and physical exam. A patch skin test may be performed to help determine the cause. If the condition is related to your job, you may need to see an occupational medicine specialist. TREATMENT Treatment for this condition includes figuring out what caused the reaction and protecting your skin from further contact. Treatment may also include:   Steroid creams or ointments. Oral steroid medicines may be needed in more severe cases.  Antibiotics or antibacterial ointments, if a skin infection is present.  Antihistamine lotion or an antihistamine taken by mouth to ease itching.  A bandage (dressing). HOME CARE INSTRUCTIONS Skin Care  Moisturize your skin as needed.   Apply cool compresses to the affected areas.  Try taking a bath with:  Epsom salts. Follow the instructions on the packaging. You can get these at your local pharmacy or grocery store.  Baking soda. Pour a small amount into the bath as directed by your health care provider.  Colloidal oatmeal. Follow the instructions on the packaging. You can get this at your local pharmacy or grocery store.  Try applying baking soda paste to your skin. Stir water into baking soda until it reaches a paste-like consistency.  Do not scratch your skin.  Bathe less frequently, such as every other day.  Bathe in lukewarm water. Avoid using hot water. Medicines  Take or apply over-the-counter and prescription medicines only as  told by your health care provider.   If you were prescribed an antibiotic medicine, take or apply your antibiotic as told by your health care provider. Do not stop using the antibiotic even if your condition starts to improve. General Instructions  Keep all follow-up visits as told by your health care provider. This is important.  Avoid the substance that caused your reaction. If you  do not know what caused it, keep a journal to try to track what caused it. Write down:  What you eat.  What cosmetic products you use.  What you drink.  What you wear in the affected area. This includes jewelry.  If you were given a dressing, take care of it as told by your health care provider. This includes when to change and remove it. SEEK MEDICAL CARE IF:   Your condition does not improve with treatment.  Your condition gets worse.  You have signs of infection such as swelling, tenderness, redness, soreness, or warmth in the affected area.  You have a fever.  You have new symptoms. SEEK IMMEDIATE MEDICAL CARE IF:   You have a severe headache, neck pain, or neck stiffness.  You vomit.  You feel very sleepy.  You notice red streaks coming from the affected area.  Your bone or joint underneath the affected area becomes painful after the skin has healed.  The affected area turns darker.  You have difficulty breathing.   This information is not intended to replace advice given to you by your health care provider. Make sure you discuss any questions you have with your health care provider.   Document Released: 07/11/2000 Document Revised: 04/04/2015 Document Reviewed: 11/29/2014 Elsevier Interactive Patient Education [employer].

## 2016-02-08 NOTE — Progress Notes (Signed)
   Subjective:    Patient ID: Shannon [name], female    DOB: 1951-02-05, 65 y.o.   MRN: [id number]  Chief Complaint  Patient presents with  . Rash    both forearms x2weeks    Medications, allergies, past medical history, surgical history, family history, social history and problem list reviewed and updated.  HPI  38 yof presents with above complaint.   Noticed itchy rash bilateral forearms and legs past 2 weeks. No fevers or chills. Has been working outside with short sleeves and shorts planting multiple plants/vegetables. Thinks she may be allergic to cucumber plant. Denies new lotions, detergents, soaps, meds.   Had some clobetasol topical for prior rash which she applied the past couple days with some relief. Unfortunately ran out. Also mentions that med was 140$ to fill and would like a different option this time.   Review of Systems See HPI     Objective:   Physical Exam  Constitutional: She is oriented to person, place, and time. She appears well-developed and well-nourished.  Non-toxic appearance. She does not have a sickly appearance. She does not appear ill. No distress.  BP 124/80 mmHg  Pulse 98  Temp(Src) 98.8 F (37.1 C) (Oral)  Resp 18  Ht 4\' 9"  (1.448 m)  Wt 121 lb (54.885 kg)  BMI 26.18 kg/m2  SpO2 98%   Neurological: She is alert and oriented to person, place, and time.  Skin:  Diffuse maculopapular rash noted bilateral arms and legs. Slight scaling over several lesions. No elbow/knee involvement. No webbing of hands/feet or palms/soles involved. No purulence. No chest/back/abodmen/facial involvement.   Psychiatric: She has a normal mood and affect. Her speech is normal and behavior is normal.      Assessment & Plan:   Contact dermatitis - Plan: triamcinolone ointment (KENALOG) 0.5 % --likely from recent gardening  --triamcinolone bid for 2 wks --rtc with no relief --long sleeves/covering for further work with plant   Julieta Gutting,  PA-C Physician Assistant-Certified [facility]  02/08/2016 3:04 PM

## 2016-04-18 ENCOUNTER — Ambulatory Visit (INDEPENDENT_AMBULATORY_CARE_PROVIDER_SITE_OTHER): Payer: Medicare HMO | Admitting: Physician Assistant

## 2016-04-18 VITALS — BP 128/84 | HR 92 | Temp 98.5°F | Resp 16 | Ht <= 58 in | Wt 120.0 lb

## 2016-04-18 DIAGNOSIS — R21 Rash and other nonspecific skin eruption: Secondary | ICD-10-CM | POA: Diagnosis not present

## 2016-04-18 DIAGNOSIS — Z23 Encounter for immunization: Secondary | ICD-10-CM

## 2016-04-18 MED ORDER — PERMETHRIN 5 % EX CREA: TOPICAL_CREAM | CUTANEOUS | 0 refills | Status: DC

## 2016-04-18 MED ORDER — CLOBETASOL PROPIONATE 0.05 % EX LOTN: TOPICAL_LOTION | CUTANEOUS | 6 refills | Status: DC

## 2016-04-18 NOTE — Progress Notes (Signed)
   04/18/2016 11:03 AM   DOB: 1950/11/17 / MRN: [id number]  SUBJECTIVE:  Shannon [name] is a 65 y.o. female presenting for rash on the bilateral arms and feet.  Seen previously for same and receiving topical steroid which made the rash go away.  Complains of itching about the rash, no pain.  Is out of steroid cream now and would like more.  Has a total of 6 people living in her home.  No fever, chills, nausea.    She has No Known Allergies.   She  has no past medical history on file.    She  reports that she has never smoked. She does not have any smokeless tobacco history on file. She reports that she does not drink alcohol or use drugs. She  reports that she currently engages in sexual activity. The patient  has no past surgical history on file.  Her family history is not on file.  Review of Systems  Constitutional: Negative for chills and fever.  Skin: Positive for itching and rash.  Neurological: Negative for dizziness.    The problem list and medications were reviewed and updated by myself where necessary and exist elsewhere in the encounter.   OBJECTIVE:  BP 128/84   Pulse 92   Temp 98.5 F (36.9 C) (Oral)   Resp 16   Ht 4\' 9"  (1.448 m)   Wt 120 lb (54.4 kg)   SpO2 97%   BMI 25.97 kg/m   Physical Exam  Cardiovascular: Normal rate.   Pulmonary/Chest: Effort normal.  Skin: Skin is warm and dry. Rash (papulopustular lesion about he forearms and feet, non tender, no exudate. ) noted.      No results found for this or any previous visit (from the past 72 hour(s)).  No results found.  ASSESSMENT AND PLAN  Shannon [name] was seen today for rash.  Diagnoses and all orders for this visit:  Need for prophylactic vaccination and inoculation against influenza -     Flu Vaccine QUAD 36+ mos IM  Rash and nonspecific skin eruption: Scabies vs contact dermatitis.  Will treat for both.  No fever or other systemic symptoms to suggest a serious pathology.  -     permethrin  (ACTICIN) 5 % cream; Apply from the neck down and leave on for eight hours. -     Clobetasol Propionate 0.05 % lotion; Apply a small about to rash daily.    The patient is advised to call or return to clinic if she does not see an improvement in symptoms, or to seek the care of the closest emergency department if she worsens with the above plan.   Philis Fendt, MHS, PA-C [facility] 04/18/2016 11:03 AM

## 2016-04-18 NOTE — Patient Instructions (Signed)
     IF you received an x-ray today, you will receive an invoice from [facility]. Please contact [facility] at [phone] with questions or concerns regarding your invoice.   IF you received labwork today, you will receive an invoice from [facility]. Please contact [facility] at [phone] with questions or concerns regarding your invoice.   Our billing staff will not be able to assist you with questions regarding bills from these companies.  You will be contacted with the lab results as soon as they are available. The fastest way to get your results is to activate your My Chart account. Instructions are located on the last page of this paperwork. If you have not heard from us regarding the results in 2 weeks, please contact this office.      

## 2016-06-13 ENCOUNTER — Ambulatory Visit (INDEPENDENT_AMBULATORY_CARE_PROVIDER_SITE_OTHER): Payer: Medicare HMO | Admitting: Family Medicine

## 2016-06-13 VITALS — BP 132/84 | HR 97 | Temp 98.4°F | Resp 16 | Ht <= 58 in | Wt 124.0 lb

## 2016-06-13 DIAGNOSIS — R21 Rash and other nonspecific skin eruption: Secondary | ICD-10-CM

## 2016-06-13 DIAGNOSIS — Z23 Encounter for immunization: Secondary | ICD-10-CM

## 2016-06-13 MED ORDER — ZOSTER VACCINE LIVE 19400 UNT/0.65ML ~~LOC~~ SUSR: 0.6500 mL | Freq: Once | SUBCUTANEOUS | 0 refills | Status: DC

## 2016-06-13 MED ORDER — ZOSTER VACCINE LIVE 19400 UNT/0.65ML ~~LOC~~ SUSR: 0.6500 mL | Freq: Once | SUBCUTANEOUS | 0 refills | Status: AC

## 2016-06-13 MED ORDER — CLOBETASOL PROPIONATE 0.05 % EX LOTN: TOPICAL_LOTION | CUTANEOUS | 6 refills | Status: DC

## 2016-06-13 NOTE — Progress Notes (Signed)
Subjective:  By signing my name below, I, Essence Howell, attest that this documentation has been prepared under the direction and in the presence of Delman Cheadle, MD Electronically Signed: Ladene Artist, ED Scribe 06/13/2016 at 9:45 AM.   Patient ID: Shannon [name], female    DOB: 09-08-1950, 65 y.o.   MRN: [id number]  Chief Complaint  Patient presents with  . Immunizations    shingles vaccine, pt was prescription   HPI HPI Comments: Adalei Bess is a 65 y.o. female, with no pertinent medical h/o, who presents to the [facility] for a shingles vaccine. Pt was seen in the office in April for a CPE by Clarene Reamer, FNP. Pt does not recall having Chickenpox as a child.  No past medical history on file. Current Outpatient Prescriptions on File Prior to Visit  Medication Sig Dispense Refill  . Clobetasol Propionate 0.05 % lotion Apply a small about to rash daily. (Patient not taking: Reported on 06/13/2016) 59 mL 6  . permethrin (ACTICIN) 5 % cream Apply from the neck down and leave on for eight hours. (Patient not taking: Reported on 06/13/2016) 60 g 0  . triamcinolone ointment (KENALOG) 0.5 % Apply 1 application topically 2 (two) times daily. (Patient not taking: Reported on 06/13/2016) 30 g 1   No current facility-administered medications on file prior to visit.    No Known Allergies   Depression screen [facility] 2/9 06/13/2016 02/08/2016 11/06/2015  Decreased Interest 0 0 0  Down, Depressed, Hopeless 0 0 0  PHQ - 2 Score 0 0 0    Review of Systems  Constitutional: Negative for activity change, appetite change, chills, diaphoresis, fatigue and fever.  Skin: Positive for rash. Negative for wound.  Hematological: Negative for adenopathy.   BP 132/84 (BP Location: Right Arm, Patient Position: Sitting, Cuff Size: Normal)   Pulse 97   Temp 98.4 F (36.9 C)   Resp 16   Ht 4\' 10"  (1.473 m)   Wt 124 lb (56.2 kg)   SpO2 98%   BMI 25.92 kg/m     Objective:     Physical Exam  Constitutional: She is oriented to person, place, and time. She appears well-developed and well-nourished. No distress.  HENT:  Head: Normocephalic and atraumatic.  Eyes: Conjunctivae and EOM are normal.  Neck: Neck supple. No tracheal deviation present.  Cardiovascular: Normal rate, regular rhythm, S1 normal, S2 normal and normal heart sounds.   Pulmonary/Chest: Effort normal and breath sounds normal. No respiratory distress.  Musculoskeletal: Normal range of motion.  Neurological: She is alert and oriented to person, place, and time.  Skin: Skin is warm and dry.  Psychiatric: She has a normal mood and affect. Her behavior is normal.  Nursing note and vitals reviewed.     Assessment & Plan:   1. Rash and nonspecific skin eruption   2. Need for shingles vaccine      Meds ordered this encounter  Medications  . DISCONTD: Zoster Vaccine Live, PF, (ZOSTAVAX) 16109 UNT/0.65ML injection    Sig: Inject 19,400 Units into the skin once.    Dispense:  1 vial    Refill:  0  . Zoster Vaccine Live, PF, (ZOSTAVAX) 60454 UNT/0.65ML injection    Sig: Inject 19,400 Units into the skin once.    Dispense:  1 vial    Refill:  0  . Clobetasol Propionate 0.05 % lotion    Sig: Apply a small about to rash daily.    Dispense:  118  mL    Refill:  6    I personally performed the services described in this documentation, which was scribed in my presence. The recorded information has been reviewed and considered, and addended by me as needed.   Delman Cheadle, M.D.  [facility] [address] [place], [place] [address] [phone] phone [phone] fax  06/25/16 10:15 PM

## 2016-06-13 NOTE — Patient Instructions (Addendum)
IF you received an x-ray today, you will receive an invoice from [facility]. Please contact [facility] at [phone] with questions or concerns regarding your invoice.   IF you received labwork today, you will receive an invoice from [employer]. Please contact [facility] at [phone] with questions or concerns regarding your invoice.   Our billing staff will not be able to assist you with questions regarding bills from these companies.  You will be contacted with the lab results as soon as they are available. The fastest way to get your results is to activate your My Chart account. Instructions are located on the last page of this paperwork. If you have not heard from [place] regarding the results in 2 weeks, please contact this office.      Shingles Shingles is an infection that causes a painful skin rash and fluid-filled blisters. Shingles is caused by the same virus that causes chickenpox. Shingles only develops in people who:  Have had chickenpox.  Have gotten the chickenpox vaccine. (This is rare.) The first symptoms of shingles may be itching, tingling, or pain in an area on your skin. A rash will follow in a few days or weeks. The rash is usually on one side of the body in a bandlike or beltlike pattern. Over time, the rash turns into fluid-filled blisters that break open, scab over, and dry up. Medicines may:  Help you manage pain.  Help you recover more quickly.  Help to prevent long-term problems. Follow these instructions at home: Medicines  Take medicines only as told by your doctor.  Apply an anti-itch or numbing cream to the affected area as told by your doctor. Blister and Rash Care  Take a cool bath or put cool compresses on the area of the rash or blisters as told by your doctor. This may help with pain and itching.  Keep your rash covered with a loose bandage (dressing). Wear loose-fitting clothing.  Keep your  rash and blisters clean with mild soap and cool water or as told by your doctor.  Check your rash every day for signs of infection. These include redness, swelling, and pain that lasts or gets worse.  Do not pick your blisters.  Do not scratch your rash. General instructions  Rest as told by your doctor.  Keep all follow-up visits as told by your doctor. This is important.  Until your blisters scab over, your infection can cause chickenpox in people who have never had it or been vaccinated against it. To prevent this from happening, avoid touching other people or being around other people, especially:  Babies.  Pregnant women.  Children who have eczema.  Elderly people who have transplants.  People who have chronic illnesses, such as leukemia or AIDS. Contact a doctor if:  Your pain does not get better with medicine.  Your pain does not get better after the rash heals.  Your rash looks infected. Signs of infection include:  Redness.  Swelling.  Pain that lasts or gets worse. Get help right away if:  The rash is on your face or nose.  You have pain in your face, pain around your eye area, or loss of feeling on one side of your face.  You have ear pain or you have ringing in your ear.  You have loss of taste.  Your condition gets worse. This information is not intended to replace advice given to you by your health care provider. Make sure you discuss  any questions you have with your health care provider. Document Released: 12/31/2007 Document Revised: 03/09/2016 Document Reviewed: 04/25/2014 Elsevier Interactive Patient Education  2017 Elsevier Inc.  

## 2016-09-01 ENCOUNTER — Encounter: Payer: Self-pay | Admitting: Family Medicine

## 2016-09-01 ENCOUNTER — Ambulatory Visit (INDEPENDENT_AMBULATORY_CARE_PROVIDER_SITE_OTHER): Payer: Medicare HMO | Admitting: Family Medicine

## 2016-09-01 VITALS — BP 140/78 | HR 94 | Temp 98.3°F | Resp 18 | Ht <= 58 in | Wt 120.0 lb

## 2016-09-01 DIAGNOSIS — R300 Dysuria: Secondary | ICD-10-CM | POA: Diagnosis not present

## 2016-09-01 DIAGNOSIS — R319 Hematuria, unspecified: Secondary | ICD-10-CM | POA: Diagnosis not present

## 2016-09-01 DIAGNOSIS — R350 Frequency of micturition: Secondary | ICD-10-CM

## 2016-09-01 LAB — POCT URINALYSIS DIP (MANUAL ENTRY)
BILIRUBIN UA: NEGATIVE
GLUCOSE UA: NEGATIVE
Ketones, POC UA: NEGATIVE
NITRITE UA: NEGATIVE
PH UA: 7
PROTEIN UA: NEGATIVE
SPEC GRAV UA: 1.01
UROBILINOGEN UA: 0.2

## 2016-09-01 MED ORDER — NITROFURANTOIN MONOHYD MACRO 100 MG PO CAPS: 100.0000 mg | ORAL_CAPSULE | Freq: Two times a day (BID) | ORAL | 0 refills | Status: DC

## 2016-09-01 NOTE — Progress Notes (Signed)
Subjective:    Patient ID: Shannon [name], female    DOB: 1951-01-01, 66 y.o.   MRN: [id number] Chief Complaint  Patient presents with  . Dysuria    HPI   Shannon [name] is a 65 yo woman who is here w/ UTI sxs.  Her husband accompanies her to help translate. External burning after urination and new urinary freq, none prior. No other sxs.  No f/c/abd pain/n/v. Normal bowels, no vag d/c or bleeding.  CPE UTD.  History reviewed. No pertinent past medical history. History reviewed. No pertinent surgical history. No current outpatient prescriptions on file prior to visit.   No current facility-administered medications on file prior to visit.    No Known Allergies History reviewed. No pertinent family history. Social History   Social History  . Marital status: Married    Spouse name: N/A  . Number of children: N/A  . Years of education: N/A   Social History Main Topics  . Smoking status: Never Smoker  . Smokeless tobacco: Never Used  . Alcohol use No  . Drug use: No  . Sexual activity: Yes   Other Topics Concern  . None   Social History Narrative  . None   Depression screen [facility] 2/9 09/15/2016 09/15/2016 09/01/2016 06/13/2016 02/08/2016  Decreased Interest 0 0 0 0 0  Down, Depressed, Hopeless 0 0 0 0 0  PHQ - 2 Score 0 0 0 0 0     Review of Systems  Constitutional: Negative for activity change, appetite change, chills, diaphoresis, fatigue, fever and unexpected weight change.  Gastrointestinal: Negative for abdominal pain, blood in stool, constipation, diarrhea, nausea and vomiting.  Genitourinary: Positive for dysuria, frequency and urgency. Negative for decreased urine volume, difficulty urinating, enuresis, flank pain, genital sores, hematuria, menstrual problem, pelvic pain, vaginal bleeding and vaginal discharge.  Musculoskeletal: Negative for gait problem.  Skin: Negative for rash.  Hematological: Negative for adenopathy.  Psychiatric/Behavioral: The patient is  not nervous/anxious.        Objective:   Physical Exam  Constitutional: She is oriented to person, place, and time. She appears well-developed and well-nourished. No distress.  HENT:  Head: Normocephalic and atraumatic.  Cardiovascular: Normal rate, regular rhythm, normal heart sounds and intact distal pulses.   Pulmonary/Chest: Effort normal and breath sounds normal.  Abdominal: Soft. Bowel sounds are normal. She exhibits no distension. There is no hepatosplenomegaly. There is no tenderness. There is no rebound, no guarding and no CVA tenderness.  Neurological: She is alert and oriented to person, place, and time.  Skin: Skin is warm and dry. She is not diaphoretic.  Psychiatric: She has a normal mood and affect. Her behavior is normal.     BP 140/78 (BP Location: Right Arm, Patient Position: Sitting, Cuff Size: Small)   Pulse 94   Temp 98.3 F (36.8 C) (Oral)   Resp 18   Ht 4\' 10"  (1.473 m)   Wt 120 lb (54.4 kg)   SpO2 97%   BMI 25.08 kg/m   Results for orders placed or performed in visit on 09/01/16  Urine culture  Result Value Ref Range   Urine Culture, Routine Final report (A)    Urine Culture result 1 Escherichia coli (A)    ANTIMICROBIAL SUSCEPTIBILITY Comment   Urinalysis, microscopic only  Result Value Ref Range   WBC, UA >30 (A) 0 - 5 /hpf   RBC, UA 0-2 0 - 2 /hpf   Epithelial Cells (non renal) 0-10 0 - 10 /hpf  Casts None seen None seen /lpf   Mucus, UA Present Not Estab.   Bacteria, UA Few None seen/Few  POCT urinalysis dipstick  Result Value Ref Range   Color, UA yellow yellow   Clarity, UA clear clear   Glucose, UA negative negative   Bilirubin, UA negative negative   Ketones, POC UA negative negative   Spec Grav, UA 1.010    Blood, UA moderate (A) negative   pH, UA 7.0    Protein Ur, POC negative negative   Urobilinogen, UA 0.2    Nitrite, UA Negative Negative   Leukocytes, UA small (1+) (A) Negative        Assessment & Plan:   1. Dysuria    2. Urinary frequency   3. Hematuria, unspecified type   Suspect UTI due to sxs but there is a language barrier and her UA is more sig for hematuria along with just small leuks, few bact so recheck in 2 wks, sooner if worse.  Orders Placed This Encounter  Procedures  . Urine culture  . Urinalysis, microscopic only  . POCT urinalysis dipstick    Meds ordered this encounter  Medications  . nitrofurantoin, macrocrystal-monohydrate, (MACROBID) 100 MG capsule    Sig: Take 1 capsule (100 mg total) by mouth 2 (two) times daily.    Dispense:  14 capsule    Refill:  0      Delman Cheadle, M.D.  [facility] at [facility] [address] [place], [place] [address] [phone] phone [phone] fax  09/20/16 11:50 PM

## 2016-09-01 NOTE — Patient Instructions (Addendum)
     IF you received an x-ray today, you will receive an invoice from [facility]. Please contact [facility] at [phone] with questions or concerns regarding your invoice.   IF you received labwork today, you will receive an invoice from [facility]. Please contact [facility] at [phone] with questions or concerns regarding your invoice.   Our billing staff will not be able to assist you with questions regarding bills from these companies.  You will be contacted with the lab results as soon as they are available. The fastest way to get your results is to activate your My Chart account. Instructions are located on the last page of this paperwork. If you have not heard from [place] regarding the results in 2 weeks, please contact this office.     Urinary Tract Infection, Adult Introduction A urinary tract infection (UTI) is an infection of any part of the urinary tract. The urinary tract includes the:  Kidneys.  Ureters.  Bladder.  Urethra. These organs make, store, and get rid of pee (urine) in the body. Follow these instructions at home:  Take over-the-counter and prescription medicines only as told by your doctor.  If you were prescribed an antibiotic medicine, take it as told by your doctor. Do not stop taking the antibiotic even if you start to feel better.  Avoid the following drinks:  Alcohol.  Caffeine.  Tea.  Carbonated drinks.  Drink enough fluid to keep your pee clear or pale yellow.  Keep all follow-up visits as told by your doctor. This is important.  Make sure to:  Empty your bladder often and completely. Do not to hold pee for long periods of time.  Empty your bladder before and after sex.  Wipe from front to back after a bowel movement if you are female. Use each tissue one time when you wipe. Contact a doctor if:  You have back pain.  You have a fever.  You feel sick to your stomach (nauseous).  You throw up (vomit).  Your  symptoms do not get better after 3 days.  Your symptoms go away and then come back. Get help right away if:  You have very bad back pain.  You have very bad lower belly (abdominal) pain.  You are throwing up and cannot keep down any medicines or water. This information is not intended to replace advice given to you by your health care provider. Make sure you discuss any questions you have with your health care provider. Document Released: 12/31/2007 Document Revised: 12/20/2015 Document Reviewed: 06/04/2015  2017 Elsevier

## 2016-09-02 LAB — URINALYSIS, MICROSCOPIC ONLY: Casts: NONE SEEN /lpf

## 2016-09-03 ENCOUNTER — Encounter: Payer: Self-pay | Admitting: Family Medicine

## 2016-09-03 LAB — URINE CULTURE

## 2016-09-05 ENCOUNTER — Telehealth: Payer: Self-pay | Admitting: Emergency Medicine

## 2016-09-05 NOTE — Telephone Encounter (Signed)
-----   Message from Shawnee Knapp, MD sent at 09/03/2016  5:36 PM EST ----- Please let pt know that she DID have a bladder infection and that the nitrofurantoin antibiotic that I prescribed her is effective against it. She needs to make sure to complete the entire antibiotic course but does NOT need to follow-up in office for recheck of the blood in the urine - so can cancel her 2/19 visit - unless she continues to have any symptoms. Otherwise, we can just recheck at her next routine office visit which will likely be her full physical due in April.   Pt does not speak any English (from [place]) but her husband speaks a small amount. Would probably be best to use a [place] interpretor though. Thanks!

## 2016-09-11 ENCOUNTER — Telehealth: Payer: Self-pay | Admitting: Emergency Medicine

## 2016-09-15 ENCOUNTER — Ambulatory Visit (INDEPENDENT_AMBULATORY_CARE_PROVIDER_SITE_OTHER): Payer: Medicare HMO | Admitting: Family Medicine

## 2016-09-15 ENCOUNTER — Encounter: Payer: Self-pay | Admitting: Family Medicine

## 2016-09-15 VITALS — BP 141/88 | HR 94 | Temp 98.5°F | Resp 16 | Ht <= 58 in | Wt 120.0 lb

## 2016-09-15 DIAGNOSIS — R3129 Other microscopic hematuria: Secondary | ICD-10-CM

## 2016-09-15 LAB — POCT URINALYSIS DIP (MANUAL ENTRY)
BILIRUBIN UA: NEGATIVE
BILIRUBIN UA: NEGATIVE
Glucose, UA: NEGATIVE
Leukocytes, UA: NEGATIVE
Nitrite, UA: NEGATIVE
PH UA: 7
Protein Ur, POC: NEGATIVE
RBC UA: NEGATIVE
Urobilinogen, UA: 0.2

## 2016-09-15 NOTE — Patient Instructions (Addendum)
   IF you received an x-ray today, you will receive an invoice from [facility]. Please contact [facility] at [phone] with questions or concerns regarding your invoice.   IF you received labwork today, you will receive an invoice from [facility]. Please contact [facility] at [phone] with questions or concerns regarding your invoice.   Our billing staff will not be able to assist you with questions regarding bills from these companies.  You will be contacted with the lab results as soon as they are available. The fastest way to get your results is to activate your My Chart account. Instructions are located on the last page of this paperwork. If you have not heard from us regarding the results in 2 weeks, please contact this office.     Mediterranean Diet A Mediterranean diet refers to food and lifestyle choices that are based on the traditions of countries located on the [place] Sea. This way of eating has been shown to help prevent certain conditions and improve outcomes for people who have chronic diseases, like kidney disease and heart disease. What are tips for following this plan? Lifestyle  Cook and eat meals together with your family, when possible.  Drink enough fluid to keep your urine clear or pale yellow.  Be physically active every day. This includes:  Aerobic exercise like running or swimming.  Leisure activities like gardening, walking, or housework.  Get 7-8 hours of sleep each night.  If recommended by your health care provider, drink red wine in moderation. This means 1 glass a day for nonpregnant women and 2 glasses a day for men. A glass of wine equals 5 oz (150 mL). Reading food labels  Check the serving size of packaged foods. For foods such as rice and pasta, the serving size refers to the amount of cooked product, not dry.  Check the total fat in packaged foods. Avoid foods that have saturated fat or trans fats.  Check the  ingredients list for added sugars, such as corn syrup. Shopping  At the grocery store, buy most of your food from the areas near the walls of the store. This includes:  Fresh fruits and vegetables (produce).  Grains, beans, nuts, and seeds. Some of these may be available in unpackaged forms or large amounts (in bulk).  Fresh seafood.  Poultry and eggs.  Low-fat dairy products.  Buy whole ingredients instead of prepackaged foods.  Buy fresh fruits and vegetables in-season from local farmers markets.  Buy frozen fruits and vegetables in resealable bags.  If you do not have access to quality fresh seafood, buy precooked frozen shrimp or canned fish, such as tuna, salmon, or sardines.  Buy small amounts of raw or cooked vegetables, salads, or olives from the deli or salad bar at your store.  Stock your pantry so you always have certain foods on hand, such as olive oil, canned tuna, canned tomatoes, rice, pasta, and beans. Cooking  Cook foods with extra-virgin olive oil instead of using butter or other vegetable oils.  Have meat as a side dish, and have vegetables or grains as your main dish. This means having meat in small portions or adding small amounts of meat to foods like pasta or stew.  Use beans or vegetables instead of meat in common dishes like chili or lasagna.  Experiment with different cooking methods. Try roasting or broiling vegetables instead of steaming or sauteing them.  Add frozen vegetables to soups, stews, pasta, or rice.  Add nuts or seeds   for added healthy fat at each meal. You can add these to yogurt, salads, or vegetable dishes.  Marinate fish or vegetables using olive oil, lemon juice, garlic, and fresh herbs. Meal planning  Plan to eat 1 vegetarian meal one day each week. Try to work up to 2 vegetarian meals, if possible.  Eat seafood 2 or more times a week.  Have healthy snacks readily available, such as:  Vegetable sticks with hummus.  Greek  yogurt.  Fruit and nut trail mix.  Eat balanced meals throughout the week. This includes:  Fruit: 2-3 servings a day  Vegetables: 4-5 servings a day  Low-fat dairy: 2 servings a day  Fish, poultry, or lean meat: 1 serving a day  Beans and legumes: 2 or more servings a week  Nuts and seeds: 1-2 servings a day  Whole grains: 6-8 servings a day  Extra-virgin olive oil: 3-4 servings a day  Limit red meat and sweets to only a few servings a month What are my food choices?  Mediterranean diet  Recommended  Grains: Whole-grain pasta. Brown rice. Bulgar wheat. Polenta. Couscous. Whole-wheat bread. Oatmeal. Quinoa.  Vegetables: Artichokes. Beets. Broccoli. Cabbage. Carrots. Eggplant. Green beans. Chard. Kale. Spinach. Onions. Leeks. Peas. Squash. Tomatoes. Peppers. Radishes.  Fruits: Apples. Apricots. Avocado. Berries. Bananas. Cherries. Dates. Figs. Grapes. Lemons. Melon. Oranges. Peaches. Plums. Pomegranate.  Meats and other protein foods: Beans. Almonds. Sunflower seeds. Pine nuts. Peanuts. Cod. Salmon. Scallops. Shrimp. Tuna. Tilapia. Clams. Oysters. Eggs.  Dairy: Low-fat milk. Cheese. Greek yogurt.  Beverages: Water. Red wine. Herbal tea.  Fats and oils: Extra virgin olive oil. Avocado oil. Grape seed oil.  Sweets and desserts: Greek yogurt with honey. Baked apples. Poached pears. Trail mix.  Seasoning and other foods: Basil. Cilantro. Coriander. Cumin. Mint. Parsley. Sage. Rosemary. Tarragon. Garlic. Oregano. Thyme. Pepper. Balsalmic vinegar. Tahini. Hummus. Tomato sauce. Olives. Mushrooms.  Limit these  Grains: Prepackaged pasta or rice dishes. Prepackaged cereal with added sugar.  Vegetables: Deep fried potatoes (french fries).  Fruits: Fruit canned in syrup.  Meats and other protein foods: Beef. Pork. Lamb. Poultry with skin. Hot dogs. Bacon.  Dairy: Ice cream. Sour cream. Whole milk.  Beverages: Juice. Sugar-sweetened soft drinks. Beer. Liquor and  spirits.  Fats and oils: Butter. Canola oil. Vegetable oil. Beef fat (tallow). Lard.  Sweets and desserts: Cookies. Cakes. Pies. Candy.  Seasoning and other foods: Mayonnaise. Premade sauces and marinades.  The items listed may not be a complete list. Talk with your dietitian about what dietary choices are right for you. Summary  The Mediterranean diet includes both food and lifestyle choices.  Eat a variety of fresh fruits and vegetables, beans, nuts, seeds, and whole grains.  Limit the amount of red meat and sweets that you eat.  Talk with your health care provider about whether it is safe for you to drink red wine in moderation. This means 1 glass a day for nonpregnant women and 2 glasses a day for men. A glass of wine equals 5 oz (150 mL). This information is not intended to replace advice given to you by your health care provider. Make sure you discuss any questions you have with your health care provider. Document Released: 03/06/2016 Document Revised: 04/08/2016 Document Reviewed: 03/06/2016 Elsevier Interactive Patient Education  2017 Elsevier Inc.  

## 2016-09-15 NOTE — Progress Notes (Signed)
Subjective:    Patient ID: Shannon [name], female    DOB: [birth year], 66 y.o.   MRN: [id number] Chief Complaint  Patient presents with  . Follow-up    dysuria/ pt is feeling better    HPI Doing great. Sxs resolved.  Pap- aged out with neg HR HPV2015, colnoscopy due  HLD: with poor panel 2 years ago ASCVD risk 7.5%. Reviewed diet - sounds like pt is doing a heart healthy diet already and exercising at gym on treadmill every other day- at least twice a week.  Depression screen [facility] 2/9 09/15/2016 09/15/2016 09/01/2016 06/13/2016 02/08/2016  Decreased Interest 0 0 0 0 0  Down, Depressed, Hopeless 0 0 0 0 0  PHQ - 2 Score 0 0 0 0 0   No past medical history on file. No past surgical history on file. No current outpatient prescriptions on file prior to visit.   No current facility-administered medications on file prior to visit.    No Known Allergies No family history on file. Social History   Social History  . Marital status: Married    Spouse name: N/A  . Number of children: N/A  . Years of education: N/A   Social History Main Topics  . Smoking status: Never Smoker  . Smokeless tobacco: Never Used  . Alcohol use No  . Drug use: No  . Sexual activity: Yes   Other Topics Concern  . None   Social History Narrative  . None    Review of Systems  Constitutional: Negative for activity change, appetite change, chills, diaphoresis and fever.  Gastrointestinal: Negative for abdominal distention, abdominal pain, constipation, diarrhea, nausea and vomiting.  Genitourinary: Negative for decreased urine volume, difficulty urinating, dysuria, enuresis, flank pain, frequency, hematuria, urgency and vaginal bleeding.  Hematological: Negative for adenopathy.       Objective:   Physical Exam  Constitutional: She is oriented to person, place, and time. She appears well-developed and well-nourished. No distress.  HENT:  Head: Normocephalic and atraumatic.  Neck: Normal range  of motion. Neck supple. No thyromegaly present.  Cardiovascular: Normal rate, regular rhythm, normal heart sounds and intact distal pulses.   Pulmonary/Chest: Effort normal and breath sounds normal. No respiratory distress.  Abdominal: Soft. Bowel sounds are normal. There is no tenderness. There is no rebound, no guarding and no CVA tenderness.  Musculoskeletal: She exhibits no edema.  Lymphadenopathy:    She has no cervical adenopathy.  Neurological: She is alert and oriented to person, place, and time.  Skin: Skin is warm and dry. She is not diaphoretic. No erythema.  Psychiatric: She has a normal mood and affect. Her behavior is normal.         BP (!) 141/88   Pulse 94   Temp 98.5 F (36.9 C) (Oral)   Resp 16   Ht 4\' 10"  (1.473 m)   Wt 120 lb (54.4 kg)   SpO2 94%   BMI 25.08 kg/m   Results for orders placed or performed in visit on 09/15/16  POCT urinalysis dipstick  Result Value Ref Range   Color, UA yellow yellow   Clarity, UA clear clear   Glucose, UA negative negative   Bilirubin, UA negative negative   Ketones, POC UA negative negative   Spec Grav, UA <=1.005    Blood, UA negative negative   pH, UA 7.0    Protein Ur, POC negative negative   Urobilinogen, UA 0.2    Nitrite, UA Negative Negative   Leukocytes, UA  Negative Negative    Assessment & Plan:   1. Microscopic hematuria   resolved. Pt will make appt for CPE.  Orders Placed This Encounter  Procedures  . POCT urinalysis dipstick      Delman Cheadle, M.D.  Primary Care at [facility] [address] [place], [place] [address] [phone] phone [phone] fax  10/14/16 10:23 PM

## 2016-11-26 DIAGNOSIS — R030 Elevated blood-pressure reading, without diagnosis of hypertension: Secondary | ICD-10-CM | POA: Diagnosis not present

## 2016-11-26 DIAGNOSIS — Z6823 Body mass index (BMI) 23.0-23.9, adult: Secondary | ICD-10-CM | POA: Diagnosis not present

## 2016-11-26 DIAGNOSIS — Z0000 Encounter for general adult medical examination without abnormal findings: Secondary | ICD-10-CM | POA: Diagnosis not present

## 2016-11-26 NOTE — Progress Notes (Signed)
Subjective:    Shannon [name] is a 66 y.o. female who presents for Medicare Annual/Subsequent preventive examination.  Preventive Screening-Counseling & Management  Tobacco History  Smoking Status  . Never Smoker  Smokeless Tobacco  . Never Used     Problems Prior to Visit 1. none  Current Problems (verified) There are no active problems to display for this patient.   Medications Prior to Visit No current outpatient prescriptions on file prior to visit.   No current facility-administered medications on file prior to visit.     Current Medications (verified) No current outpatient prescriptions on file.   No current facility-administered medications for this visit.      Allergies (verified) Patient has no known allergies.   PAST HISTORY  Family History No family history on file.  Social History Social History  Substance Use Topics  . Smoking status: Never Smoker  . Smokeless tobacco: Never Used  . Alcohol use No     Are there smokers in your home (other than you)? No  Risk Factors Current exercise habits: Home exercise routine includes gym 3x/wk w/ treadmill, weights.  Dietary issues discussed: eats everything - rice, fish, meats, veggies. Water, tea. Fruits for dessert  Cardiac risk factors: advanced age (older than 35 for men, 62 for women).  Depression Screen Depression screen [facility] 2/9 11/27/2016 09/15/2016 09/15/2016 09/01/2016 06/13/2016  Decreased Interest 0 0 0 0 0  Down, Depressed, Hopeless 0 0 0 0 0  PHQ - 2 Score 0 0 0 0 0     Activities of Daily Living In your present state of health, do you have any difficulty performing the following activities?:  Driving? No Managing money?  No Feeding yourself? No Getting from bed to chair? No Climbing a flight of stairs? No Preparing food and eating?: No Bathing or showering? No Getting dressed: No Getting to the toilet? No Using the toilet:No Moving around from place to place: No In the past  year have you fallen or had a near fall?:No   Are you sexually active?  Yes  Do you have more than one partner?  No  Hearing Difficulties: No Do you often ask people to speak up or repeat themselves? No Do you experience ringing or noises in your ears? No Do you have difficulty understanding soft or whispered voices? No   Do you feel that you have a problem with memory? Yes - occasionally forgetful  Do you often misplace items? No  Do you feel safe at home?  Yes  Cognitive Testing  Alert? Yes  Normal Appearance?Yes  Oriented to person? Yes  Place? Yes   Time? Yes  Recall of three objects?  Yes  Can perform simple calculations? No - I suspect this was due to the language barrier - can do very simple but not backwards serial 7's or change calculations  Displays appropriate judgment?Yes    Advanced Directives have been discussed with the patient? Yes - she seemed very confused about this - fine with medical measures  List the Names of Other Physician/Practitioners you currently use: 1.  none  Indicate any recent Medical Services you may have received from other than [facility] providers in the past year (date may be approximate).  Immunization History  Administered Date(s) Administered  . Hepatitis A 07/15/2004  . Influenza,inj,Quad PF,36+ Mos 05/12/2014, 04/18/2016  . Pneumococcal Conjugate-13 11/06/2015  . Tdap 05/12/2014    Screening Tests Health Maintenance  Topic Date Due  . Hepatitis C Screening  [birth year]  .  COLONOSCOPY  11/17/1999  . DEXA SCAN  11/17/2014  . PNA vac Low Risk Adult (2 of 2 - PPSV23) 11/05/2016  . INFLUENZA VACCINE  02/25/2017  . MAMMOGRAM  03/08/2017  . TETANUS/TDAP  05/12/2024    All answers were reviewed with the patient and necessary referrals were made:  [facility], [place]   11/26/2016   History reviewed: allergies, current medications, past family history, past medical history, past social history, past surgical history and problem list  Review of  Systems Pertinent items are noted in HPI.    Objective:  BP 136/83   Pulse 92   Temp 98.3 F (36.8 C) (Oral)   Resp 16   Ht 4\' 10"  (1.473 m)   Wt 121 lb (54.9 kg)   SpO2 97%   BMI 25.29 kg/m   Visual Acuity Screening   Right eye Left eye Both eyes  Without correction:     With correction: 20/30 20/25 20/25     BP 136/83   Pulse 92   Temp 98.3 F (36.8 C) (Oral)   Resp 16   Ht 4\' 10"  (1.473 m)   Wt 121 lb (54.9 kg)   SpO2 97%   BMI 25.29 kg/m   General Appearance:    Alert, cooperative, no distress, appears stated age  Head:    Normocephalic, without obvious abnormality, atraumatic  Eyes:    PERRL, conjunctiva/corneas clear, EOM's intact, fundi    benign, both eyes  Ears:    Normal TM on right, left occluded with cerumen, nml external ear and canals, both ears  Nose:   Nares normal, septum midline, mucosa normal, no drainage    or sinus tenderness  Throat:   Lips, mucosa, and tongue normal; teeth and gums normal  Neck:   Supple, symmetrical, trachea midline, no adenopathy;    thyroid:  no enlargement/tenderness/nodules; no carotid   bruit or JVD  Back:     Symmetric, no curvature, ROM normal, no CVA tenderness  Lungs:     Clear to auscultation bilaterally, respirations unlabored  Chest Wall:    No tenderness or deformity   Heart:    Regular rate and rhythm, S1 and S2 normal, no murmur, rub   or gallop  Breast Exam:    No tenderness, masses, or nipple abnormality  Abdomen:     Soft, non-tender, bowel sounds active all four quadrants,    no masses, no organomegaly        Extremities:   Extremities normal, atraumatic, no cyanosis or edema  Pulses:   2+ and symmetric all extremities  Skin:   Skin color, texture, turgor normal, no rashes or lesions  Lymph nodes:   Cervical, supraclavicular, and axillary nodes normal  Neurologic:   CNII-XII intact, normal strength, sensation and reflexes    throughout       Assessment:     1. Medicare annual wellness visit,  subsequent   2. Routine screening for STI (sexually transmitted infection)   3. Encounter for screening mammogram for breast cancer   4. Screening for cardiovascular, respiratory, and genitourinary diseases   5. Screening for cervical cancer   6. Screening for colorectal cancer   7. Screening for deficiency anemia   8. Screening for thyroid disorder   9. Pure hypercholesterolemia   10. Estrogen deficiency   11. Left ear impacted cerumen   12. Elevated blood pressure reading         Plan:  Hep C, ua, cbc, cmp, lipid - hld Pneumovax-23 Given zostavax rx  05/2016 - did she every get - she doesn't think she did get. Refer for mammogram and DEXA to breast center Refer for colonoscopy   During the course of the visit the patient was educated and counseled about appropriate screening and preventive services including:     Primary Preventative Screenings: Cervical Cancer: pap 11/11/13 nml with neg HPV at 66 yo. Pt may only have had the 1 pap smear (has 4 sons and lived in [place] since 1981 so if any were born here she likely has had) STI screening: No h/o and no risks Breast Cancer: Mammogram 01/2015 at breast center - right calcifications that were nml on further eval Colorectal Cancer: none, was referred last yr to [facility]??? Tobacco use: none Bone Density: refer for DEXA Cardiac: Weight/blood sugar: OTC/vit/supp/herbal: ca?? Vit D?? - one a day and fish oil, occ milk Diet/Exercise/EtOH/substances: goes to the gym 3x/wk and gardens, no EtOH. Dentist/Optho: none  Immunizations:  Immunization History  Administered Date(s) Administered  . Hepatitis A 07/15/2004  . Influenza,inj,Quad PF,36+ Mos 05/12/2014, 04/18/2016  . Pneumococcal Conjugate-13 11/06/2015  . Tdap 05/12/2014     Diet review for nutrition referral? Yes ____  Not Indicated _x___  1. Medicare annual wellness visit, subsequent   2. Routine screening for STI (sexually transmitted infection)   3.  Encounter for screening mammogram for breast cancer   4. Screening for cardiovascular, respiratory, and genitourinary diseases   5. Screening for cervical cancer   6. Screening for colorectal cancer   7. Screening for deficiency anemia   8. Screening for thyroid disorder   9. Pure hypercholesterolemia   10. Estrogen deficiency   11. Left ear impacted cerumen   12. Elevated blood pressure reading     Orders Placed This Encounter  Procedures  . MM Digital Screening    Standing Status:   Future    Standing Expiration Date:   01/26/2018    Order Specific Question:   Reason for Exam (SYMPTOM  OR DIAGNOSIS REQUIRED)    Answer:   screening    Order Specific Question:   Preferred imaging location?    Answer:   [facility]  . DG Bone Density    Standing Status:   Future    Standing Expiration Date:   01/26/2018    Order Specific Question:   Reason for Exam (SYMPTOM  OR DIAGNOSIS REQUIRED)    Answer:   estrogen deficiency screening    Order Specific Question:   Preferred imaging location?    Answer:   [facility]  . Pneumococcal polysaccharide vaccine 23-valent greater than or equal to 2yo subcutaneous/IM  . Hepatitis C Antibody  . CBC  . Comprehensive metabolic panel    Order Specific Question:   Has the patient fasted?    Answer:   Yes  . Lipid panel    Order Specific Question:   Has the patient fasted?    Answer:   Yes  . Ambulatory referral to Gastroenterology    Referral Priority:   Routine    Referral Type:   Consultation    Referral Reason:   Specialty Services Required    Number of Visits Requested:   1  . POCT urinalysis dipstick     Delman Cheadle, M.D.  Primary Care at [facility] [place], [place] [address] [phone] phone [phone] fax  11/30/16 12:00 AM  Patient Instructions (the written plan) was given to the patient.  Medicare Attestation I have personally reviewed:  The patient's medical and social history Their use of  alcohol, tobacco or illicit drugs Their current medications and supplements The patient's functional ability including ADLs,fall risks, home safety risks, cognitive, and hearing and visual impairment Diet and physical activities Evidence for depression or mood disorders  The patient's weight, height, BMI, and visual acuity have been recorded in the chart.  I have made referrals, counseling, and provided education to the patient based on review of the above and I have provided the patient with a written personalized care plan for preventive services.     Delman Cheadle, MD   11/26/2016

## 2016-11-27 ENCOUNTER — Ambulatory Visit (INDEPENDENT_AMBULATORY_CARE_PROVIDER_SITE_OTHER): Payer: Medicare HMO | Admitting: Family Medicine

## 2016-11-27 VITALS — BP 136/83 | HR 92 | Temp 98.3°F | Resp 16 | Ht <= 58 in | Wt 121.0 lb

## 2016-11-27 DIAGNOSIS — E7800 Pure hypercholesterolemia, unspecified: Secondary | ICD-10-CM

## 2016-11-27 DIAGNOSIS — R030 Elevated blood-pressure reading, without diagnosis of hypertension: Secondary | ICD-10-CM

## 2016-11-27 DIAGNOSIS — Z130 Encounter for screening for diseases of the blood and blood-forming organs and certain disorders involving the immune mechanism: Secondary | ICD-10-CM | POA: Diagnosis not present

## 2016-11-27 DIAGNOSIS — Z0000 Encounter for general adult medical examination without abnormal findings: Secondary | ICD-10-CM | POA: Diagnosis not present

## 2016-11-27 DIAGNOSIS — Z1383 Encounter for screening for respiratory disorder NEC: Secondary | ICD-10-CM

## 2016-11-27 DIAGNOSIS — E2839 Other primary ovarian failure: Secondary | ICD-10-CM

## 2016-11-27 DIAGNOSIS — Z1211 Encounter for screening for malignant neoplasm of colon: Secondary | ICD-10-CM | POA: Diagnosis not present

## 2016-11-27 DIAGNOSIS — Z1329 Encounter for screening for other suspected endocrine disorder: Secondary | ICD-10-CM | POA: Diagnosis not present

## 2016-11-27 DIAGNOSIS — Z113 Encounter for screening for infections with a predominantly sexual mode of transmission: Secondary | ICD-10-CM

## 2016-11-27 DIAGNOSIS — H6122 Impacted cerumen, left ear: Secondary | ICD-10-CM

## 2016-11-27 DIAGNOSIS — Z124 Encounter for screening for malignant neoplasm of cervix: Secondary | ICD-10-CM | POA: Diagnosis not present

## 2016-11-27 DIAGNOSIS — Z136 Encounter for screening for cardiovascular disorders: Secondary | ICD-10-CM

## 2016-11-27 DIAGNOSIS — Z1231 Encounter for screening mammogram for malignant neoplasm of breast: Secondary | ICD-10-CM | POA: Diagnosis not present

## 2016-11-27 DIAGNOSIS — Z1212 Encounter for screening for malignant neoplasm of rectum: Secondary | ICD-10-CM

## 2016-11-27 DIAGNOSIS — R69 Illness, unspecified: Secondary | ICD-10-CM | POA: Diagnosis not present

## 2016-11-27 DIAGNOSIS — Z1389 Encounter for screening for other disorder: Secondary | ICD-10-CM

## 2016-11-27 DIAGNOSIS — Z23 Encounter for immunization: Secondary | ICD-10-CM

## 2016-11-27 LAB — POCT URINALYSIS DIP (MANUAL ENTRY)
BILIRUBIN UA: NEGATIVE mg/dL
Bilirubin, UA: NEGATIVE
Glucose, UA: NEGATIVE mg/dL
Leukocytes, UA: NEGATIVE
Nitrite, UA: NEGATIVE
PROTEIN UA: NEGATIVE mg/dL
Urobilinogen, UA: 0.2 E.U./dL
pH, UA: 7 (ref 5.0–8.0)

## 2016-11-27 NOTE — Patient Instructions (Addendum)
You need to schedule you mammogram and your bone scan.  Please call the breast center at [place] at [phone] to schedule - they already have the order from me.   IF you received an x-ray today, you will receive an invoice from [facility]. Please contact [facility] at [phone] with questions or concerns regarding your invoice.   IF you received labwork today, you will receive an invoice from [facility]. Please contact [facility] at [phone] with questions or concerns regarding your invoice.   Our billing staff will not be able to assist you with questions regarding bills from these companies.  You will be contacted with the lab results as soon as they are available. The fastest way to get your results is to activate your My Chart account. Instructions are located on the last page of this paperwork. If you have not heard from [place] regarding the results in 2 weeks, please contact this office.     Ms. Shannon [name] , Thank you for taking time to come for your Medicare Wellness Visit. I appreciate your ongoing commitment to your health goals. Please review the following plan we discussed and let me know if I can assist you in the future.   These are the goals we discussed: Goals    None      This is a list of the screening recommended for you and due dates:  Health Maintenance  Topic Date Due  .  Hepatitis C: One time screening is recommended by Center for Disease Control  (CDC) for  adults born from [birth year] through 1966.   [birth year]  . Colon Cancer Screening  11/17/1999  . DEXA scan (bone density measurement)  11/17/2014  . Pneumonia vaccines (2 of 2 - PPSV23) 11/05/2016  . Flu Shot  02/25/2017  . Mammogram  03/08/2017  . Tetanus Vaccine  05/12/2024   Calcium; Vitamin D chewable tablet ?y l thu?c g? CALCI/VITAMIN D l ch? ph?m b? sung vitamin. N ???c dng ?? phng ng?a cc tnh tr?ng c m?c calci v vitamin D th?p. Thu?c ny c th? ???c dng cho  nh?ng m?c ?ch khc; hy h?i ng??i cung c?p d?ch v? y t? ho?c d??c s? c?a mnh, n?u qu v? c th?c m?c. (CC) NHN HI?U PH? BI?N: Calcet Citrate Soft Chew, Caltrate 600+D, Citracal Calcium, Citracal Creamy Bites, Os-Cal 500 Plus D, OSCAL with Vitamin D3, Oysco 500 + D Ti c?n ph?i bo cho ng??i cung c?p d?ch v? y t? c?a mnh ?i?u g tr??c khi dng thu?c ny? H? c?n bi?t li?u qu v? c b?t k? tnh tr?ng no sau ?y khng: -to bn -b? m?t n??c -b?nh tim -m?c calci ho?c vitamin D trong mu th?p -hm l??ng phosphate trong mu cao -b?nh th?n -s?n th?n -b?nh gan -b?nh tuy?n c?n gip tr?ng -b?nh sarcoid -lot ho??c t?c ngh?n bao t? -pha?n ??ng b?t th???ng ho??c di? ??ng v??i calci, vitamin D, ho?c thu?c nhu?m tartrazine -pha?n ??ng b?t th???ng ho??c di? ??ng v??i ca?c d??c ph?m kha?c, th?c ph?m, thu?c nhu?m, ho??c ch?t ba?o qua?n -?ang c thai ho??c ??nh co? thai -?ang cho con bu? Ti nn s? d?ng thu?c ny nh? th? no? U?ng thu?c ny. Nhai hon ton tr??c khi nu?t. Hy lm theo cc h??ng d?n trn h?p thu?c ho?c nhn thu?c. U?ng thu?c ny cng v?i th?c ?n ho?c sau b?a ?n chnh trong vng 1 gi? ??ng h?. Dng thu?c ny vo nh?ng kho?ng th?i gian ??u nhau. Khng ???c dng thu?c ny nhi?u l?n  h?n ? ???c ch? d?n. Hy bn v?i bc s? nhi khoa c?a qu v? v? vi?c dng thu?c ny ? tr? em. Thu?c ny ? ???c dng cho tr? nh? trong nh?ng tr??ng h?p ch?n l?c, nh?ng c?n ph?i th?n tr?ng. Qu li?u: N?u qu v? cho r?ng mnh ? dng qu nhi?u thu?c ny, th hy lin l?c v?i trung tm ki?m sot ch?t ??c ho?c phng c?p c?u ngay l?p t?c. L?U : Thu?c ny ch? dnh ring cho qu v?. Khng chia s? thu?c ny v?i nh?ng ng??i khc. N?u ti l? qun m?t li?u th sao? N?u qu v? l? qun m?t li?u thu?c, hy dng li?u thu?c ? ngay khi c th?. N?u h?u nh? ? ??n gi? dng li?u thu?c k? ti?p, th ch? dng li?u thu?c k? ti?p ?Marland Kitchen Khng ???c dng li?u g?p ?i ho?c dng thm li?u. Nh?ng g c th? t??ng tc v?i thu?c  ny? Khng ???c dng thu?c ny cng v?i b?t k? th? no sau ?y: -ammonium chloride -methenamine Thu?c ny c?ng c th? t??ng tc v?i cc thu?c sau ?y: -m?t s? thu?c khng sinh, ch?ng h?n nh? ciprofloxacin, gatifloxacin, tetracycline -captopril -delavirdine -cc thu?c l?i ti?u -gabapentin -cc ch? ph?m b? sung s?t -m?t s? thu?c dng ?? tr? cc b?nh nhi?m n?m, ch?ng h?n nh? itraconazole, ketoconazole -m?t s? thu?c dng cho cc ch?ng co gi?t, ch?ng h?n nh? ethotoin v phenytoin -d?u khong -mycophenolate -cc vitamin khc c ch?a calci, vitamin D, ho?c cc khong ch?t -quinidine -rosuvastatin -sucralfate -cc thu?c dng cho tuy?n gip tr?ng Danh sch ny c th? khng m t? ?? h?t cc t??ng tc c th? x?y ra. Hy ??a cho ng??i cung c?p d?ch v? y t? c?a mnh danh sch t?t c? cc thu?c, th?o d??c, cc thu?c khng c?n toa, ho?c cc ch? ph?m b? sung m qu v? dng. C?ng nn bo cho h? bi?t r?ng qu v? c ht thu?c, u?ng r??u, ho?c c s? d?ng ma ty tri php hay khng. Vi th? c th? t??ng tc v?i thu?c c?a qu v?. Ti c?n ph?i theo di ?i?u g trong khi dng thu?c ny? Dng d??c ph?m ny khng ph?i l bi?n php thay th? cho m?t ch? ?? ?n cn ??i v rn luy?n thn th?. Hy bn v?i bc s? ho?c chuyn vin y t? v th?c hi?n m?t l?i s?ng lnh m?nh. Khng ???c dng thu?c ny cng v?i cc th?c ?n c nhi?u ch?t x?, cng v?i m?t l??ng l?n r??u, ho?c cng v?i ?? u?ng c ch?a caffeine. Trnh dng cc thu?c khc trong vng 2 gi? ??ng h? tnh t? lc dng thu?c ny. Ti c th? nh?n th?y nh?ng tc d?ng ph? no khi dng thu?c ny? Nh?ng tc d?ng ph? qu v? c?n ph?i bo cho bc s? ho?c chuyn vin y t? cng s?m cng t?t: -cc ph?n ?ng d? ?ng, ch?ng h?n nh? da b? m?n ??, ng?a, n?i my ?ay, s?ng ? m?t, mi, ho?c l??i -l l?n -kh mi?ng -huy?t a?p cao -t?ng c?m gic ?i ho?c kht -hay bu?n ti?u -nh?p tim khng ??u -v? nh? kim lo?i trong mi?ng -?au x??ng ho?c c? b?p -?au khi ?i ti?u ho?c kh ?i ti?u -co  gi?t (kinh phong) -m?t m?i ho?c y?u ?t b?t th??ng -gi?m cn Cc tc d?ng ph? khng c?n ph?i ch?m Dudley y t? (hy bo cho bc s? ho?c chuyn vin y t?, n?u cc tc d?ng ph? ny ti?p di?n ho?c gy phi?n toi): -to bn -tiu ch?y -?au ??u -m?t c?m  gic ngon mi?ng -bu?n i ho?c i m?a -kh ch?u ? bao t? Danh sch ny c th? khng m t? ?? h?t cc tc d?ng ph? c th? x?y ra. Xin g?i t?i bc s? c?a mnh ?? ???c c? v?n chuyn mn v? cc tc d?ng ph?Sander Nephew v? c th? t??ng trnh cc tc d?ng ph? cho FDA theo s? [phone]. Ti nn c?t gi? thu?c c?a mnh ? ?u? ?? ngoi t?m tay tr? em. C?t gi? ? nhi?t ?? phng t? 15 ??n 30 ?? C (58 ??n 85 ?? F). Trnh nh sng. ?ng ch?t gi/h?p thu?c. V?t b? t?t c? thu?c ch?a dng sau ngy h?t h?n in trn nhn thu?c ho?c bao thu?c. L?U : ?y l b?n tm t?t. N c th? khng bao hm t?t c? thng tin c th? c. N?u qu v? th?c m?c v? thu?c ny, xin trao ??i v?i bc s?, d??c s?, ho?c ng??i cung c?p d?ch v? y t? c?a mnh.  2018 Elsevier/Gold Standard (2014-08-30 00:00:00)  Calcium Intake Recommendations Calcium is a mineral that affects many functions in the body, including:  Blood clotting.  Blood vessel function.  Nerve impulse conduction.  Hormone secretion.  Muscle contraction.  Bone and teeth functions. Most of your body's calcium supply is stored in your bones and teeth. When your calcium stores are low, you may be at risk for low bone mass, bone loss, and bone fractures. Consuming enough calcium helps to grow healthy bones and teeth and to prevent breakdown over time. It is very important that you get enough calcium if you are:  A child undergoing rapid growth.  An adolescent girl.  A pre- or post-menopausal woman.  A woman whose menstrual cycle has stopped due to anorexia nervosa or regular intense exercise.  An individual with lactose intolerance or a milk allergy.  A vegetarian. What is my plan? Try to consume the recommended amount of  calcium daily based on your age. Depending on your overall health, your health care provider may recommend increased calcium intake.General daily calcium intake recommendations by age are:  Birth to 6 months: 200 mg.  Infants 7 to 12 months: 260 mg.  Children 1 to 3 years: 700 mg.  Children 4 to 8 years: 1,000 mg.  Children 9 to 13 years: 1,300 mg.  Teens 14 to 18 years: 1,300 mg.  Adults 19 to 50 years: 1,000 mg.  Adult women 51 to 70 years: 1,200 mg.  Adult men 51 to 70 years: 1,000 mg.  Adults 71 years and older: 1,200 mg.  Pregnant and breastfeeding teens: 1,300 mg.  Pregnant and breastfeeding adults: 1,000 mg. What do I need to know about calcium intake?  In order for the body to absorb calcium, it needs vitamin D. You can get vitamin D through:  Direct exposure of the skin to sunlight.  Foods, such as egg yolks, liver, saltwater fish, and fortified milk.  Supplements.  Consuming too much calcium may cause:  Constipation.  Decreased absorption of iron and zinc.  Kidney stones.  Calcium supplements may interact with certain medicines. Check with your health care provider before starting any calcium supplements.  Try to get most of your calcium from food. What foods can I eat? Grains   Fortified oatmeal. Fortified ready-to-eat cereals. Fortified frozen waffles. Vegetables  Turnip greens. Broccoli. Fruits  Fortified orange juice. Meats and Other Protein Sources  Canned sardines with bones. Canned salmon with bones. Soy beans. Tofu. Baked beans. Almonds. [place] nuts. Sunflower seeds. Dairy  Milk. Yogurt. Cheese. Cottage cheese. Beverages  Fortified soy milk. Fortified rice milk. Sweets/Desserts  Pudding. Ice Cream. Milkshakes. Blackstrap molasses. The items listed above may not be a complete list of recommended foods or beverages. Contact your dietitian for more options.  What foods can affect my calcium intake? It may be more difficult for your body  to use calcium or calcium may leave your body more quickly if you consume large amounts of:  Sodium.  Protein.  Caffeine.  Alcohol. This information is not intended to replace advice given to you by your health care provider. Make sure you discuss any questions you have with your health care provider. Document Released: 02/26/2004 Document Revised: 02/01/2016 Document Reviewed: 12/20/2013 Elsevier Interactive Patient Education  2017 [employer].

## 2016-11-28 LAB — COMPREHENSIVE METABOLIC PANEL
ALBUMIN: 4.4 g/dL (ref 3.6–4.8)
ALT: 49 IU/L — ABNORMAL HIGH (ref 0–32)
AST: 37 IU/L (ref 0–40)
Albumin/Globulin Ratio: 1.4 (ref 1.2–2.2)
Alkaline Phosphatase: 99 IU/L (ref 39–117)
BUN / CREAT RATIO: 17 (ref 12–28)
BUN: 11 mg/dL (ref 8–27)
Bilirubin Total: 0.4 mg/dL (ref 0.0–1.2)
CALCIUM: 9.9 mg/dL (ref 8.7–10.3)
CO2: 27 mmol/L (ref 18–29)
CREATININE: 0.66 mg/dL (ref 0.57–1.00)
Chloride: 96 mmol/L (ref 96–106)
GFR, EST AFRICAN AMERICAN: 106 mL/min/{1.73_m2} (ref >59)
GFR, EST NON AFRICAN AMERICAN: 92 mL/min/{1.73_m2} (ref >59)
GLOBULIN, TOTAL: 3.1 g/dL (ref 1.5–4.5)
GLUCOSE: 92 mg/dL (ref 65–99)
Potassium: 4 mmol/L (ref 3.5–5.2)
Sodium: 139 mmol/L (ref 134–144)
TOTAL PROTEIN: 7.5 g/dL (ref 6.0–8.5)

## 2016-11-28 LAB — CBC
HEMATOCRIT: 36.8 % (ref 34.0–46.6)
HEMOGLOBIN: 12.3 g/dL (ref 11.1–15.9)
MCH: 27.5 pg (ref 26.6–33.0)
MCHC: 33.4 g/dL (ref 31.5–35.7)
MCV: 82 fL (ref 79–97)
Platelets: 258 10*3/uL (ref 150–379)
RBC: 4.48 x10E6/uL (ref 3.77–5.28)
RDW: 14.3 % (ref 12.3–15.4)
WBC: 7.2 10*3/uL (ref 3.4–10.8)

## 2016-11-28 LAB — LIPID PANEL
CHOL/HDL RATIO: 5.3 ratio — AB (ref 0.0–4.4)
CHOLESTEROL TOTAL: 243 mg/dL — AB (ref 100–199)
HDL: 46 mg/dL (ref >39)
LDL CALC: 121 mg/dL — AB (ref 0–99)
Triglycerides: 378 mg/dL — ABNORMAL HIGH (ref 0–149)
VLDL CHOLESTEROL CAL: 76 mg/dL — AB (ref 5–40)

## 2016-11-28 LAB — HEPATITIS C ANTIBODY

## 2016-12-29 ENCOUNTER — Encounter: Payer: Self-pay | Admitting: Family Medicine

## 2017-01-05 ENCOUNTER — Other Ambulatory Visit: Payer: Self-pay | Admitting: Family Medicine

## 2017-01-05 DIAGNOSIS — Z1231 Encounter for screening mammogram for malignant neoplasm of breast: Secondary | ICD-10-CM

## 2017-01-20 ENCOUNTER — Ambulatory Visit
Admission: RE | Admit: 2017-01-20 | Discharge: 2017-01-20 | Disposition: A | Discharge status: Home / Self Care | Payer: Medicare Other | Source: Ambulatory Visit | Attending: Family Medicine | Admitting: Family Medicine

## 2017-01-20 DIAGNOSIS — Z1231 Encounter for screening mammogram for malignant neoplasm of breast: Secondary | ICD-10-CM

## 2017-01-20 DIAGNOSIS — M810 Age-related osteoporosis without current pathological fracture: Secondary | ICD-10-CM | POA: Diagnosis not present

## 2017-01-20 DIAGNOSIS — E2839 Other primary ovarian failure: Secondary | ICD-10-CM

## 2017-09-22 DIAGNOSIS — Z0100 Encounter for examination of eyes and vision without abnormal findings: Secondary | ICD-10-CM | POA: Diagnosis not present

## 2017-12-03 ENCOUNTER — Encounter: Payer: Self-pay | Admitting: Family Medicine

## 2017-12-03 ENCOUNTER — Other Ambulatory Visit: Payer: Self-pay

## 2017-12-03 ENCOUNTER — Ambulatory Visit (INDEPENDENT_AMBULATORY_CARE_PROVIDER_SITE_OTHER): Payer: Medicare HMO | Admitting: Family Medicine

## 2017-12-03 ENCOUNTER — Encounter: Payer: Self-pay | Admitting: Gastroenterology

## 2017-12-03 VITALS — BP 132/88 | HR 97 | Temp 98.4°F | Resp 18 | Ht <= 58 in | Wt 119.2 lb

## 2017-12-03 DIAGNOSIS — Z0001 Encounter for general adult medical examination with abnormal findings: Secondary | ICD-10-CM

## 2017-12-03 DIAGNOSIS — Z1329 Encounter for screening for other suspected endocrine disorder: Secondary | ICD-10-CM | POA: Diagnosis not present

## 2017-12-03 DIAGNOSIS — E7800 Pure hypercholesterolemia, unspecified: Secondary | ICD-10-CM | POA: Diagnosis not present

## 2017-12-03 DIAGNOSIS — Z130 Encounter for screening for diseases of the blood and blood-forming organs and certain disorders involving the immune mechanism: Secondary | ICD-10-CM

## 2017-12-03 DIAGNOSIS — Z0000 Encounter for general adult medical examination without abnormal findings: Secondary | ICD-10-CM

## 2017-12-03 DIAGNOSIS — M810 Age-related osteoporosis without current pathological fracture: Secondary | ICD-10-CM | POA: Diagnosis not present

## 2017-12-03 DIAGNOSIS — Z1212 Encounter for screening for malignant neoplasm of rectum: Secondary | ICD-10-CM | POA: Diagnosis not present

## 2017-12-03 DIAGNOSIS — Z1211 Encounter for screening for malignant neoplasm of colon: Secondary | ICD-10-CM

## 2017-12-03 DIAGNOSIS — Z1231 Encounter for screening mammogram for malignant neoplasm of breast: Secondary | ICD-10-CM

## 2017-12-03 DIAGNOSIS — Z136 Encounter for screening for cardiovascular disorders: Secondary | ICD-10-CM | POA: Diagnosis not present

## 2017-12-03 DIAGNOSIS — R3129 Other microscopic hematuria: Secondary | ICD-10-CM

## 2017-12-03 DIAGNOSIS — Z1383 Encounter for screening for respiratory disorder NEC: Secondary | ICD-10-CM | POA: Diagnosis not present

## 2017-12-03 DIAGNOSIS — Z1389 Encounter for screening for other disorder: Secondary | ICD-10-CM

## 2017-12-03 DIAGNOSIS — L508 Other urticaria: Secondary | ICD-10-CM

## 2017-12-03 LAB — POC MICROSCOPIC URINALYSIS (UMFC): Mucus: ABSENT

## 2017-12-03 LAB — POCT URINALYSIS DIP (MANUAL ENTRY)
Bilirubin, UA: NEGATIVE
Glucose, UA: NEGATIVE mg/dL
Ketones, POC UA: NEGATIVE mg/dL
LEUKOCYTES UA: NEGATIVE
NITRITE UA: NEGATIVE
PH UA: 7 (ref 5.0–8.0)
PROTEIN UA: NEGATIVE mg/dL
Spec Grav, UA: 1.01 (ref 1.010–1.025)
UROBILINOGEN UA: 0.2 U/dL

## 2017-12-03 MED ORDER — ALENDRONATE SODIUM 70 MG PO TABS: 70.0000 mg | ORAL_TABLET | ORAL | 4 refills | Status: DC

## 2017-12-03 MED ORDER — LEVOCETIRIZINE DIHYDROCHLORIDE 5 MG PO TABS: 5.0000 mg | ORAL_TABLET | Freq: Every evening | ORAL | 3 refills | Status: DC

## 2017-12-03 MED ORDER — HYDROXYZINE HCL 25 MG PO TABS: 25.0000 mg | ORAL_TABLET | ORAL | 5 refills | Status: DC | PRN

## 2017-12-03 NOTE — Progress Notes (Signed)
Subjective:    Patient ID: Shannon [name], female    DOB: [birth year], 67 y.o.   MRN: [id number] Chief Complaint  Patient presents with  . Annual Exam    With pap     HPI   Primary Preventative Screenings: Cervical Cancer: pap 11/11/13 nml with neg HPV at 67 yo. Pt may only have had the 1 pap smear (has 4 sons and lived in [place] since 1981 so if any were born here she likely has had) STI screening: No h/o and no risks Breast Cancer: Mammogram 01/2015 at breast center - right calcifications that were nml on further eval Colorectal Cancer: none, was referred last yr to [facility]??? Tobacco use: none Bone Density: refer for DEXA Cardiac: Weight/blood sugar: OTC/vit/supp/herbal: ca?? Vit D?? - one a day and fish oil, occ milk Diet/Exercise/EtOH/substances: goes to the gym 3x/wk and gardens, no EtOH. Dentist/Optho: none  Primary Preventative Screenings: Cervical Cancer:  Family Planning: STI screening: Breast Cancer: Colorectal Cancer: Tobacco use/EtOH/substances: Bone Density: Cardiac: Weight/Blood sugar/Diet/Exercise: BMI Readings from Last 3 Encounters:  12/03/17 24.91 kg/m  11/27/16 25.29 kg/m  09/15/16 25.08 kg/m   No results found for: HGBA1C OTC/Vit/Supp/Herbal: Dentist/Optho: Immunizations:  Immunization History  Administered Date(s) Administered  . Hepatitis A 07/15/2004  . Influenza,inj,Quad PF,6+ Mos 05/12/2014, 04/18/2016  . Pneumococcal Conjugate-13 11/06/2015  . Pneumococcal Polysaccharide-23 11/27/2016  . Tdap 05/12/2014  . Zoster 05/28/2016     Chronic Medical Conditions: Rash and itching for a long time but doesn't have any currently. Happens more when she is run. Is mainly around the neck and back and bothers her at night.  Prior medicine helped a little but not strong enough - will only go away for 3-4 hours in the exact same spot.  Is dry and itchy. Last thing we tried was clobetasol 18 mos ago. Not taking ANY medication for otc  other than prn benadryl, has tried zyrtec w/ same results - resolved for 3-4 hrs only.    History reviewed. No pertinent past medical history. History reviewed. No pertinent surgical history. No current outpatient medications on file prior to visit.   No current facility-administered medications on file prior to visit.    No Known Allergies History reviewed. No pertinent family history. Social History   Socioeconomic History  . Marital status: Married    Spouse name: Not on file  . Number of children: Not on file  . Years of education: Not on file  . Highest education level: Not on file  Occupational History  . Not on file  Social Needs  . Financial resource strain: Not on file  . Food insecurity:    Worry: Not on file    Inability: Not on file  . Transportation needs:    Medical: Not on file    Non-medical: Not on file  Tobacco Use  . Smoking status: Never Smoker  . Smokeless tobacco: Never Used  Substance and Sexual Activity  . Alcohol use: No  . Drug use: No  . Sexual activity: Yes  Lifestyle  . Physical activity:    Days per week: Not on file    Minutes per session: Not on file  . Stress: Not on file  Relationships  . Social connections:    Talks on phone: Not on file    Gets together: Not on file    Attends religious service: Not on file    Active member of club or organization: Not on file    Attends meetings of  clubs or organizations: Not on file    Relationship status: Not on file  Other Topics Concern  . Not on file  Social History Narrative  . Not on file   Depression screen [facility] 2/9 12/03/2017 11/27/2016 09/15/2016 09/15/2016 09/01/2016  Decreased Interest 0 0 0 0 0  Down, Depressed, Hopeless 0 0 0 0 0  PHQ - 2 Score 0 0 0 0 0       Review of Systems See hpi    Objective:   Physical Exam  Constitutional: She is oriented to person, place, and time. She appears well-developed and well-nourished. No distress.  HENT:  Head: Normocephalic and  atraumatic.  Right Ear: External ear normal.  Left Ear: External ear normal.  Eyes: Conjunctivae are normal. No scleral icterus.  Neck: Normal range of motion. Neck supple. No thyromegaly present.  Cardiovascular: Normal rate, regular rhythm, normal heart sounds and intact distal pulses.  Pulmonary/Chest: Effort normal and breath sounds normal. No respiratory distress.  Musculoskeletal: She exhibits no edema.  Lymphadenopathy:    She has no cervical adenopathy.  Neurological: She is alert and oriented to person, place, and time.  Skin: Skin is warm and dry. She is not diaphoretic. No erythema.  Psychiatric: She has a normal mood and affect. Her behavior is normal.  BP 132/88 (BP Location: Left Arm, Patient Position: Sitting, Cuff Size: Normal)   Pulse 97   Temp 98.4 F (36.9 C) (Oral)   Resp 18   Ht 4\' 10"  (1.473 m)   Wt 119 lb 3.2 oz (54.1 kg)   SpO2 99%   BMI 24.91 kg/m      Assessment & Plan:   1. Annual physical exam   2. Screening for cardiovascular, respiratory, and genitourinary diseases   3. Encounter for screening mammogram for breast cancer   4. Screening for colorectal cancer   5. Screening for deficiency anemia   6. Screening for thyroid disorder   7. Pure hypercholesterolemia   8. Microscopic hematuria   9. Chronic urticaria   10. Age-related osteoporosis without current pathological fracture     Orders Placed This Encounter  Procedures  . Lipid panel    Order Specific Question:   Has the patient fasted?    Answer:   Yes  . Comprehensive metabolic panel    Order Specific Question:   Has the patient fasted?    Answer:   Yes  . TSH  . CBC with Differential/Platelet  . Food Allergy Profile  . ALLERGY EVALUATION 2, [facility]  . Allergen Panel (27) + IGE  . Allergen Panel (27) + IGE  . Food Allergy Profile  . Ambulatory referral to Gastroenterology    Referral Priority:   Routine    Referral Type:   Consultation    Referral Reason:   Specialty Services  Required    Number of Visits Requested:   1  . POCT urinalysis dipstick  . POCT Microscopic Urinalysis ([facility])    Meds ordered this encounter  Medications  . alendronate (FOSAMAX) 70 MG tablet    Sig: Take 1 tablet (70 mg total) by mouth every 7 (seven) days. Take with a full glass of water on an empty stomach.    Dispense:  12 tablet    Refill:  4  . levocetirizine (XYZAL) 5 MG tablet    Sig: Take 1 tablet (5 mg total) by mouth every evening.    Dispense:  90 tablet    Refill:  3  . hydrOXYzine (ATARAX/VISTARIL) 25  MG tablet    Sig: Take 1-2 tablets (25-50 mg total) by mouth every 4 (four) hours as needed for itching (rash).    Dispense:  60 tablet    Refill:  5    Delman Cheadle, MD, MPH Primary Care at [place], [place]  [id number] [phone] Office phone  [phone] Office fax   05/05/18 8:46 PM

## 2017-12-03 NOTE — Patient Instructions (Addendum)
Start the xyzal once a day -try taking this every night before bed - you ware going to need to stay on this permanently for your rash.  If you still get an itchy red rash at times, then go ahead and take a hydroxyzine whenever it appears.  Start the fosamax (alendronate) once a week.  This is to help strengthen your bones which have become thinned and weakened as your body has had to take calcium from them in effort to keep your blood calcium level normal. Therefore, it is recommended that you start on the alendronate  to hopefully keep this from worsening while you continue to take adequate daily amounts of calcium and vitamin D. Make sure to take a calcium supplement that is 600mg  at a DIFFERENT time than your multivitamin. However, you can take the vitamin D supplement at any time you want and with any of your other vitamins/supplements (other than the alendronate).   I have sent fosamax to your pharmacy. Take this once a week, first thing in the morning on an empty stomach with water only. For the next 30 minutes you must remain upright (do not lay back down as we want to use gravity to ensure that the pill remains in the stomach). It is very difficult for your body to absorb this medicine so wait at least 30 minutes before eating, drinking, or taking any other medications/vitamins/supplements. It is recommended to have any needed dental work completed prior to starting this medication. We will repeat the bone scan in 2 years to assess progression.   While your body can take calcium out of your bones, it can no longer put calcium back into your bones which is why it is so important to make sure you that you are getting an adequate oral intake of calcium and vitamin D on a daily basis through your diet and/or supplements. Weight-bearing exercise for at least 30 minutes 3 days a week can also help.  The main dietary sources of calcium include milk and other dairy products, such as cottage cheese,  yogurt, or hard cheese, and green vegetables, such as kale and broccoli. A rough method of estimating dietary calcium intake is to multiply the number of dairy servings consumed each day by 300 mg. One serving is 8 oz of milk (236 mL) or yogurt (224 g), 1 oz (28 g) of hard cheese, or 16 oz (448 g) of cottage cheese.  You need to schedule your mammogram.  Please call [place] at [phone] to schedule.  You will be called by [facility] (Gastroenterology) to schedule an appointment to learn about the colonoscopy and the preparation for it and another appointment for the colonoscopy itself.    IF you received an x-ray today, you will receive an invoice from [facility]. Please contact [facility] at [phone] with questions or concerns regarding your invoice.   IF you received labwork today, you will receive an invoice from [facility]. Please contact [facility] at [phone] with questions or concerns regarding your invoice.   Our billing staff will not be able to assist you with questions regarding bills from these companies.  You will be contacted with the lab results as soon as they are available. The fastest way to get your results is to activate your My Chart account. Instructions are located on the last page of this paperwork. If you have not heard from [place] regarding the results in 2 weeks, please contact this office.    Bone  Health Bones protect organs, store calcium, and anchor muscles. Good health habits, such as eating nutritious foods and exercising regularly, are important for maintaining healthy bones. They can also help to prevent a condition that causes bones to lose density and become weak and brittle (osteoporosis). Why is bone mass important? Bone mass refers to the amount of bone tissue that you have. The higher your bone mass, the stronger your bones. An important step toward having healthy bones throughout life is to have strong  and dense bones during childhood. A young adult who has a high bone mass is more likely to have a high bone mass later in life. Bone mass at its greatest it is called peak bone mass. A large decline in bone mass occurs in older adults. In women, it occurs about the time of menopause. During this time, it is important to practice good health habits, because if more bone is lost than what is replaced, the bones will become less healthy and more likely to break (fracture). If you find that you have a low bone mass, you may be able to prevent osteoporosis or further bone loss by changing your diet and lifestyle. How can I find out if my bone mass is low? Bone mass can be measured with an X-ray test that is called a bone mineral density (BMD) test. This test is recommended for all women who are age 68 or older. It may also be recommended for men who are age 25 or older, or for people who are more likely to develop osteoporosis due to:  Having bones that break easily.  Having a long-term disease that weakens bones, such as kidney disease or rheumatoid arthritis.  Having menopause earlier than normal.  Taking medicine that weakens bones, such as steroids, thyroid hormones, or hormone treatment for breast cancer or prostate cancer.  Smoking.  Drinking three or more alcoholic drinks each day.  What are the nutritional recommendations for healthy bones? To have healthy bones, you need to get enough of the right minerals and vitamins. Most nutrition experts recommend getting these nutrients from the foods that you eat. Nutritional recommendations vary from person to person. Ask your health care provider what is healthy for you. Here are some general guidelines. Calcium Recommendations Calcium is the most important (essential) mineral for bone health. Most people can get enough calcium from their diet, but supplements may be recommended for people who are at risk for osteoporosis. Good sources of calcium  include:  Dairy products, such as low-fat or nonfat milk, cheese, and yogurt.  Dark green leafy vegetables, such as bok choy and broccoli.  Calcium-fortified foods, such as orange juice, cereal, bread, soy beverages, and tofu products.  Nuts, such as almonds.  Follow these recommended amounts for daily calcium intake:  Children, age 74?3: 700 mg.  Children, age 34?8: 1,000 mg.  Children, age 38?13: 1,300 mg.  Teens, age 39?18: 1,300 mg.  Adults, age 61?50: 1,000 mg.  Adults, age 66?70: ? Men: 1,000 mg. ? Women: 1,200 mg.  Adults, age 23 or older: 1,200 mg.  Pregnant and breastfeeding females: ? Teens: 1,300 mg. ? Adults: 1,000 mg.  Vitamin D Recommendations Vitamin D is the most essential vitamin for bone health. It helps the body to absorb calcium. Sunlight stimulates the skin to make vitamin D, so be sure to get enough sunlight. If you live in a cold climate or you do not get outside often, your health care provider may recommend that you take vitamin  D supplements. Good sources of vitamin D in your diet include:  Egg yolks.  Saltwater fish.  Milk and cereal fortified with vitamin D.  Follow these recommended amounts for daily vitamin D intake:  Children and teens, age 30?18: 49 international units.  Adults, age 55 or younger: 400-800 international units.  Adults, age 8 or older: 800-1,000 international units.  Other Nutrients Other nutrients for bone health include:  Phosphorus. This mineral is found in meat, poultry, dairy foods, nuts, and legumes. The recommended daily intake for adult men and adult women is 700 mg.  Magnesium. This mineral is found in seeds, nuts, dark green vegetables, and legumes. The recommended daily intake for adult men is 400?420 mg. For adult women, it is 310?320 mg.  Vitamin K. This vitamin is found in green leafy vegetables. The recommended daily intake is 120 mg for adult men and 90 mg for adult women.  What type of physical  activity is best for building and maintaining healthy bones? Weight-bearing and strength-building activities are important for building and maintaining peak bone mass. Weight-bearing activities cause muscles and bones to work against gravity. Strength-building activities increases muscle strength that supports bones. Weight-bearing and muscle-building activities include:  Walking and hiking.  Jogging and running.  Dancing.  Gym exercises.  Lifting weights.  Tennis and racquetball.  Climbing stairs.  Aerobics.  Adults should get at least 30 minutes of moderate physical activity on most days. Children should get at least 60 minutes of moderate physical activity on most days. Ask your health care provide what type of exercise is best for you. Where can I find more information? For more information, check out the following websites:  [place]: YardHomes.se  [employer] of Health: http://www.niams.AnonymousEar.fr.asp  This information is not intended to replace advice given to you by your health care provider. Make sure you discuss any questions you have with your health care provider. Document Released: 10/04/2003 Document Revised: 02/01/2016 Document Reviewed: 07/19/2014 Elsevier Interactive Patient Education  2018 [place] ban [place]ives Pha?t ban (ma?y ?ay) la? nh??ng ch? ng??a, ?o? va? s?ng trn da quy? vi?. Pha?t ban co? th? xu?t hi?n trn b?t ky? b? ph?n na?o trn c? th? va? co? th? co? ki?ch th???c kha?c nhau. Chu?ng co? th? chi? nho? b??ng ??u bu?t ho??c l??n h?n nhi?u. Pha?t ban th???ng h?t trong vo?ng 24 gi?? (pha?t ban c?p ti?nh). Trong nh??ng tr???ng h??p kha?c, ca?c v?t pha?t ban m??i xu?t hi?n sau khi ca?c v?t cu? m?t d?n. Chu ky? na?y co? th? ti?p tu?c trong va?i nga?y ho??c va?i tu?n (pha?t ban ma?n ti?nh). Pha?t ban la? k?t qua? cu?a pha?n ??ng cu?a c?  th? quy? vi? v??i m?t ch?t ki?ch thi?ch ho??c v??i m?t th?? ma? quy? vi? bi? di? ??ng (ta?c nhn kh??i pha?t). Khi quy? vi? ti?p xu?c v??i m?t ta?c nhn kh?i pha?t, c? th? quy? vi? s? gia?i pho?ng m?t ho?a ch?t (histamine) gy ?o?, ng??a va? s?ng. Quy? vi? co? th? bi? pha?t ban ngay sau khi ti?p xu?c v??i ta?c nhn kh??i pha?t ho??c sau ?o? ha?ng gi??. Pht ban khng ly t? ng??i sang ng??i (khng ly lan). Cc vng pht ban cu?a quy? vi? c th? tr?m tr?ng h?n n?u gi, t?p th? d?c v b? c?ng th?ng tinh th?n. Nguyn nhn g gy ra? Nh??ng nguyn nhn gy ra tnh tr?ng ny bao g?m:  Di? ??ng v??i m?t s? loa?i th??c ?n ho??c cc tha?nh ph?n nh?t ??nh.  Cn trng c?n ho?c ??  t.  Ti?p xu?c v??i ph?n hoa ho??c va?y da v?t nui.  Ti?p xc v?i cao su ho??c ha ch?t.  ?? lu d???i n??ng, no?ng ho??c la?nh (ph?i nhi?m).  T?p luy?n.  C?ng th?ng.  Quy? vi? cu?ng co? th? bi? pha?t ban do m?t s? tnh tr?ng b?nh ly? va? cc bi?n php ?i?u tri?Marland Kitchen Nh?ng b?nh l ny bao g?m:  Cc vi ru?t, bao g?m c? c?m l?nh thng th??ng.  Nhi?m vi khu?n, ch??ng ha?n nh? nhi?m tru?ng ????ng ti?u va? vim ho?ng do lin c?u khu?n.  Nh??ng b?nh l nh? vim m?ch, lupus ho?c b?nh tuy?n gip.  M?t s? loa?i thu?c nh?t ??nh.  Tim thu?c ch??a di? ??ng.  Truy?n mu.  ?i khi khng r nguyn nhn gy pha?t ban (pha?t ban t?? pha?t). ?i?u g lm t?ng nguy c?? Tnh tr?ng ny hay x?y ra h?n ?:  Ph? n?.  Nh??ng ng???i bi? di? ??ng th??c ph?m, ???c bi?t l di? ??ng v??i cam quy?t, s??a, tr??ng, la?c, ca?c loa?i ha?t ho??c tm cua so? h?n.  Nh??ng ng???i bi? di? ??ng v??i: ? Thu?c. ? Cao su. ? Cn tru?ng. ? ??ng v?t. ? Ph?n hoa.  Nh??ng ng???i bi? m?t s? b?nh ly? nh?t ?i?nh, bao g?mlupus ho??c b?nh tuy?n gia?p.  Cc d?u hi?u ho?c tri?u ch?ng l g? Tri?u ch??ng chi?nh cu?a b?nh na?y la? ca?c u cu?c ho??c ma?ng ?o? ho??c tr??ng n?i ln, ng??atrn da cu?a quy? vi?. Nh??ng  vng na?y co? th?:  To ra va? s?ng ln (v?t s?ng).  Thay ??i kch th??c va? vi? tri? nhanh chng v l?p ?i l?p l?i.  La? pha?t ban ring r? ho??c k?t v??i nhau trn m?t vu?ng da l??n.  ?au nho?i ho??c tr?? nn ?au ???n.  Chuy?n tha?nh ma?u tr??ng khi ?n va?o gi??a (tr?ng nh??t).  Trong nh??ng tr???ng h??p n?ng, tay, chn va? m??tquy? vi? cu?ng co? th? bi? s?ng. ?i?u ny c th? x?y ra n?u pht ban pht tri?n su h?n trong da. Ch?n ?on tnh tr?ng ny nh? th? no? Tnh tr?ng ny c th? ???c ch?n ?on d?a vo tri?u ch??ng, khai thc b?nh s? v khm th?c th?. Da, n???c ti?u ho??c ma?u co? th? ????c xe?t nghi?m ?? ti?m nguyn nhn pha?t ban va? ?? loa?i tr?? ca?c v?n ?? s??c kho?e kha?c. Chuyn gia ch?m so?c s??c kho?e cu?ng co? th? l?y m?t m?u da nho? t?? vu?ng bi? a?nh h???ng ?? ki?m tra d???i ki?nh hi?n vi (sinh thi?t). Tnh tr?ng ny ???c ?i?u tr? nh? th? no? Vi?c ?i?u tr? ty thu?c va?o m?c ?? n?ng c?a b?nh. Chuyn gia ch?m so?c s??c kho?e co? th? khuy?n nghi? s?? du?ng mi?ng va?i la?nh, ???t (b?ng e?p la?nh) ho??c t??m n???c ma?t ?? gia?m ng??a. ?i khi pha?t ban ????c ?i?u tri? b??ng thu?c, bao g?m:  Thu?c khng histamine.  Corticosteroid.  Khng sinh.  M?t loa?i thu?c tim (omalizumab). Chuyn gia ch?m so?c s??c kho?e cu?a quy? vi? co? th? k ??n thu?c na?y n?u quy? vi? bi? pha?t ban t?? pha?t ma?n ti?nh va? quy? vi? ti?p tu?c co? ca?c tri?u ch??ng ngay ca? sau khi ?i?u tri? b??ng thu?c kha?ng histamine.  Nh??ng tr???ng h??p n?ng co? th? c?n tim kh?n c?p adrenaline (epinephrine) ?? tra?nh pha?n ??ng di? ??ng ?e do?a ma?ng s?ng (pha?n v?). Tun th? nh?ng h??ng d?n ny ? nh: Thu?c  Ch? dng ho?c bi thu?c khng k ??n v thu?c k ??n theo ch? d?n c?a chuyn gia ch?m [place] s?c kh?e.  N?u qu  v? ???c k thu?c khng sinh, hy dng thu?c theo ch? d?n c?a chuyn gia ch?m [place] s?c kh?e. Khng d?ng u?ng thu?c khng sinh ngay c? khi qu v? b?t  ??u c?m th?y ?? h?n. Ch?m so?c da  Ch??m mt vo vng b? ?nh h??ng.  Khng gi ho?c ch xt vo da qu v?. H??ng d?n chung  Khng t??m vo?i hoa sen ho??c t??m b?n b??ng n???c no?ng. Vi?c na?y co? th? la?m ng??a nhi?u thm.  Khng m??c qu?n a?o ch?t.  S?? du?ng kem ch?ng n??ng va? qu?n a?o ba?o h? khi quy? vi? ra ngoa?i tr??i.  Trnh b?t k? ch?t no ma? c th? gy pht ban. Ghi nh?t k ?? giu?p quy? vi? theo di nh?ng g lm qu v? pht ban. Ghi l?i: ? Quy? vi? du?ng thu?c gi?Marland Kitchen Sander Nephew v? ?n v u?ng g. ? Quy? vi? s?? du?ng sa?n ph?m na?o trn da.  Tun th? t?t c? cc l?n khm theo di theo ch? d?n c?a chuyn gia ch?m [place] s?c kh?e. ?i?u ny c vai tr quan tr?ng. Hy lin l?c v?i chuyn gia ch?m [place] s?c kh?e n?u:  Tri?u ch?ng c?a qu v? khng ki?m sot ???c b?ng thu?c.  Cc kh?p c?a qu v? b? ?au ho?c s?ng. Yu c?u tr? gip ngay l?p t?c n?u:  Qu v? b? s?t.  Qu v? b? ?au ? b?ng.  L??i ho?c mi c?a qu v? b? s?ng.  Mi? m??t cu?a quy? vi? bi? s?ng.  Quy? vi? c?m th?y th?t ngh?t ? ng?c ho?c ? h?ng.  Qu v? b? kh th? ho?c kh nu?t. Nh?ng tri?u ch?ng ny c th? l bi?u hi?n c?a m?t v?n ?? nghim tr?ng c?n c?p c?u. Khng ch? xem tri?u ch?ng c h?t khng. Hy ?i khm ngay l?p t?c. G?i cho d?ch v? c?p c?u t?i ??a ph??ng (911 ? [place] Khng t? li xe ??n b?nh vi?n. Thng tin ny khng nh?m m?c ?ch thay th? cho l?i khuyn m chuyn gia ch?m [place] s?c kh?e ni v?i qu v?. Hy b?o ??m qu v? ph?i th?o lu?n b?t k? v?n ?? g m qu v? c v?i chuyn gia ch?m [place] s?c kh?e c?a qu v?. Document Released: 07/14/2005 Document Revised: 10/29/2016 Document Reviewed: 05/02/2015 Elsevier Interactive Patient Education  2018 [employer].

## 2017-12-09 ENCOUNTER — Encounter: Payer: Self-pay | Admitting: Gastroenterology

## 2017-12-11 LAB — CBC WITH DIFFERENTIAL/PLATELET
Basophils Absolute: 0 10*3/uL (ref 0.0–0.2)
Basos: 0 %
EOS (ABSOLUTE): 0.1 10*3/uL (ref 0.0–0.4)
EOS: 1 %
HEMATOCRIT: 38.5 % (ref 34.0–46.6)
HEMOGLOBIN: 12.3 g/dL (ref 11.1–15.9)
IMMATURE GRANS (ABS): 0.1 10*3/uL (ref 0.0–0.1)
Immature Granulocytes: 1 %
LYMPHS: 33 %
Lymphocytes Absolute: 2.2 10*3/uL (ref 0.7–3.1)
MCH: 26.9 pg (ref 26.6–33.0)
MCHC: 31.9 g/dL (ref 31.5–35.7)
MCV: 84 fL (ref 79–97)
MONOCYTES: 6 %
Monocytes Absolute: 0.4 10*3/uL (ref 0.1–0.9)
NEUTROS PCT: 59 %
Neutrophils Absolute: 4.1 10*3/uL (ref 1.4–7.0)
Platelets: 276 10*3/uL (ref 150–379)
RBC: 4.57 x10E6/uL (ref 3.77–5.28)
RDW: 14.3 % (ref 12.3–15.4)
WBC: 6.7 10*3/uL (ref 3.4–10.8)

## 2017-12-11 LAB — LIPID PANEL
CHOL/HDL RATIO: 6 ratio — AB (ref 0.0–4.4)
Cholesterol, Total: 259 mg/dL — ABNORMAL HIGH (ref 100–199)
HDL: 43 mg/dL (ref >39)
LDL Calculated: 146 mg/dL — ABNORMAL HIGH (ref 0–99)
TRIGLYCERIDES: 348 mg/dL — AB (ref 0–149)
VLDL Cholesterol Cal: 70 mg/dL — ABNORMAL HIGH (ref 5–40)

## 2017-12-11 LAB — ALLERGEN PANEL (27) + IGE
Aspergillus Fumigatus IgE: <0.1 kU/L
Bermuda Grass IgE: <0.1 kU/L
Cat Dander IgE: <0.1 kU/L
Cedar, Mountain IgE: <0.1 kU/L
Cladosporium Herbarum IgE: <0.1 kU/L
Cockroach, American IgE: <0.1 kU/L
Common Silver Birch IgE: <0.1 kU/L
D Pteronyssinus IgE: <0.1 kU/L
Dog Dander IgE: <0.1 kU/L
IgE (Immunoglobulin E), Serum: 540 IU/mL — ABNORMAL HIGH (ref 6–495)
Johnson Grass IgE: <0.1 kU/L
Kentucky Bluegrass IgE: <0.1 kU/L
Maple/Box Elder IgE: <0.1 kU/L
Mucor Racemosus IgE: <0.1 kU/L
Penicillium Chrysogen IgE: <0.1 kU/L
Pigweed, Rough IgE: <0.1 kU/L
Ragweed, Short IgE: <0.1 kU/L
Timothy Grass IgE: <0.1 kU/L
White Mulberry IgE: <0.1 kU/L

## 2017-12-11 LAB — FOOD ALLERGY PROFILE
Clam IgE: <0.1 kU/L
Codfish IgE: <0.1 kU/L
Egg White IgE: <0.1 kU/L
Milk IgE: <0.1 kU/L
SOYBEAN IGE: 0.26 kU/L — AB
Scallop IgE: <0.1 kU/L
Sesame Seed IgE: <0.1 kU/L
Shrimp IgE: <0.1 kU/L
WHEAT IGE: 0.11 kU/L — AB
Walnut IgE: <0.1 kU/L

## 2017-12-11 LAB — COMPREHENSIVE METABOLIC PANEL
A/G RATIO: 1.4 (ref 1.2–2.2)
ALBUMIN: 4.4 g/dL (ref 3.6–4.8)
ALT: 20 IU/L (ref 0–32)
AST: 22 IU/L (ref 0–40)
Alkaline Phosphatase: 89 IU/L (ref 39–117)
BILIRUBIN TOTAL: 0.3 mg/dL (ref 0.0–1.2)
BUN / CREAT RATIO: 18 (ref 12–28)
BUN: 12 mg/dL (ref 8–27)
CHLORIDE: 101 mmol/L (ref 96–106)
CO2: 25 mmol/L (ref 20–29)
Calcium: 9.8 mg/dL (ref 8.7–10.3)
Creatinine, Ser: 0.68 mg/dL (ref 0.57–1.00)
GFR calc non Af Amer: 90 mL/min/{1.73_m2} (ref >59)
GFR, EST AFRICAN AMERICAN: 104 mL/min/{1.73_m2} (ref >59)
Globulin, Total: 3.2 g/dL (ref 1.5–4.5)
Glucose: 94 mg/dL (ref 65–99)
POTASSIUM: 3.8 mmol/L (ref 3.5–5.2)
Sodium: 142 mmol/L (ref 134–144)
TOTAL PROTEIN: 7.6 g/dL (ref 6.0–8.5)

## 2017-12-11 LAB — TSH: TSH: 3.09 u[IU]/mL (ref 0.450–4.500)

## 2017-12-18 ENCOUNTER — Encounter: Payer: Medicare Other | Admitting: Gastroenterology

## 2018-01-12 ENCOUNTER — Ambulatory Visit (AMBULATORY_SURGERY_CENTER): Payer: Self-pay

## 2018-01-12 VITALS — Ht <= 58 in | Wt 119.6 lb

## 2018-01-12 DIAGNOSIS — Z1211 Encounter for screening for malignant neoplasm of colon: Secondary | ICD-10-CM

## 2018-01-12 MED ORDER — NA SULFATE-K SULFATE-MG SULF 17.5-3.13-1.6 GM/177ML PO SOLN: 1.0000 | Freq: Once | ORAL | 0 refills | Status: AC

## 2018-01-12 NOTE — Progress Notes (Signed)
"  Simmaly"-interpretor was with the pt and husband during the PV. The interpretor verified medical hx, reviewed prep instructions and answered questions. Per interpretor, pt has no allergies to soy or egg products.Pt not taking any weight loss meds or using  O2 at home.  Refused emmi video. Pt does not have a computer!

## 2018-01-19 ENCOUNTER — Encounter: Payer: Self-pay | Admitting: Gastroenterology

## 2018-01-25 ENCOUNTER — Ambulatory Visit
Admission: RE | Admit: 2018-01-25 | Discharge: 2018-01-25 | Disposition: A | Discharge status: Home / Self Care | Payer: Medicare HMO | Source: Ambulatory Visit | Attending: Family Medicine | Admitting: Family Medicine

## 2018-01-25 DIAGNOSIS — Z136 Encounter for screening for cardiovascular disorders: Secondary | ICD-10-CM

## 2018-01-25 DIAGNOSIS — Z1383 Encounter for screening for respiratory disorder NEC: Principal | ICD-10-CM

## 2018-01-25 DIAGNOSIS — D126 Benign neoplasm of colon, unspecified: Secondary | ICD-10-CM

## 2018-01-25 DIAGNOSIS — Z1389 Encounter for screening for other disorder: Principal | ICD-10-CM

## 2018-01-25 DIAGNOSIS — Z1231 Encounter for screening mammogram for malignant neoplasm of breast: Secondary | ICD-10-CM | POA: Diagnosis not present

## 2018-01-25 HISTORY — DX: Benign neoplasm of colon, unspecified: D12.6

## 2018-02-02 ENCOUNTER — Ambulatory Visit (AMBULATORY_SURGERY_CENTER): Payer: Medicare HMO | Admitting: Gastroenterology

## 2018-02-02 ENCOUNTER — Encounter: Payer: Self-pay | Admitting: Gastroenterology

## 2018-02-02 VITALS — BP 103/61 | HR 83 | Temp 97.3°F | Resp 19 | Ht <= 58 in | Wt 119.0 lb

## 2018-02-02 DIAGNOSIS — D125 Benign neoplasm of sigmoid colon: Secondary | ICD-10-CM | POA: Diagnosis not present

## 2018-02-02 DIAGNOSIS — Z1211 Encounter for screening for malignant neoplasm of colon: Secondary | ICD-10-CM | POA: Diagnosis present

## 2018-02-02 DIAGNOSIS — D123 Benign neoplasm of transverse colon: Secondary | ICD-10-CM | POA: Diagnosis not present

## 2018-02-02 DIAGNOSIS — D120 Benign neoplasm of cecum: Secondary | ICD-10-CM | POA: Diagnosis not present

## 2018-02-02 DIAGNOSIS — K635 Polyp of colon: Secondary | ICD-10-CM

## 2018-02-02 MED ORDER — SODIUM CHLORIDE 0.9 % IV SOLN: 500.0000 mL | Freq: Once | INTRAVENOUS | Status: DC

## 2018-02-02 NOTE — Progress Notes (Signed)
A and O x3. Report to RN. Tolerated MAC anesthesia well.

## 2018-02-02 NOTE — Progress Notes (Signed)
Translator used for discharge instructions:  Simnaly S.

## 2018-02-02 NOTE — Patient Instructions (Signed)
Handouts given:  Hemorrhoids, Diverticulosis, and Polyps  YOU HAD AN ENDOSCOPIC PROCEDURE TODAY AT [facility]:   Refer to the procedure report that was given to you for any specific questions about what was found during the examination.  If the procedure report does not answer your questions, please call your gastroenterologist to clarify.  If you requested that your care partner not be given the details of your procedure findings, then the procedure report has been included in a sealed envelope for you to review at your convenience later.  YOU SHOULD EXPECT: Some feelings of bloating in the abdomen. Passage of more gas than usual.  Walking can help get rid of the air that was put into your GI tract during the procedure and reduce the bloating. If you had a lower endoscopy (such as a colonoscopy or flexible sigmoidoscopy) you may notice spotting of blood in your stool or on the toilet paper. If you underwent a bowel prep for your procedure, you may not have a normal bowel movement for a few days.  Please Note:  You might notice some irritation and congestion in your nose or some drainage.  This is from the oxygen used during your procedure.  There is no need for concern and it should clear up in a day or so.  SYMPTOMS TO REPORT IMMEDIATELY:   Following lower endoscopy (colonoscopy or flexible sigmoidoscopy):  Excessive amounts of blood in the stool  Significant tenderness or worsening of abdominal pains  Swelling of the abdomen that is new, acute  Fever of 100F or highe  For urgent or emergent issues, a gastroenterologist can be reached at any hour by calling [phone].   DIET:  We do recommend a small meal at first, but then you may proceed to your regular diet.  Drink plenty of fluids but you should avoid alcoholic beverages for 24 hours.  ACTIVITY:  You should plan to take it easy for the rest of today and you should NOT DRIVE or use heavy machinery until tomorrow  (because of the sedation medicines used during the test).    FOLLOW UP: Our staff will call the number listed on your records the next business day following your procedure to check on you and address any questions or concerns that you may have regarding the information given to you following your procedure. If we do not reach you, we will leave a message.  However, if you are feeling well and you are not experiencing any problems, there is no need to return our call.  We will assume that you have returned to your regular daily activities without incident.  If any biopsies were taken you will be contacted by phone or by letter within the next 1-3 weeks.  Please call us at [phone] if you have not heard about the biopsies in 3 weeks.    SIGNATURES/CONFIDENTIALITY: You and/or your care partner have signed paperwork which will be entered into your electronic medical record.  These signatures attest to the fact that that the information above on your After Visit Summary has been reviewed and is understood.  Full responsibility of the confidentiality of this discharge information lies with you and/or your care-partner.

## 2018-02-02 NOTE — Progress Notes (Signed)
Called to room to assist during endoscopic procedure.  Patient ID and intended procedure confirmed with present staff. Received instructions for my participation in the procedure from the performing physician.  

## 2018-02-02 NOTE — Op Note (Signed)
[place] Patient Name: Shannon [name] Procedure Date: 02/02/2018 3:02 PM MRN: [id number] Endoscopist: Ladene Artist , MD Age: 67 Referring MD:  Date of Birth: Dec 13, 1950 Gender: Female Account #: [id number] Procedure:                Colonoscopy Indications:              Screening for colorectal malignant neoplasm Medicines:                Monitored Anesthesia Care Procedure:                Pre-Anesthesia Assessment:                           - Prior to the procedure, a History and Physical                            was performed, and patient medications and                            allergies were reviewed. The patient's tolerance of                            previous anesthesia was also reviewed. The risks                            and benefits of the procedure and the sedation                            options and risks were discussed with the patient.                            All questions were answered, and informed consent                            was obtained. Prior Anticoagulants: The patient has                            taken no previous anticoagulant or antiplatelet                            agents. ASA Grade Assessment: II - A patient with                            mild systemic disease. After reviewing the risks                            and benefits, the patient was deemed in                            satisfactory condition to undergo the procedure.                           After obtaining informed consent, the colonoscope  was passed under direct vision. Throughout the                            procedure, the patient's blood pressure, pulse, and                            oxygen saturations were monitored continuously. The                            Model PCF-[id number]) scope was introduced                            through the anus and advanced to the the cecum,                            identified  by appendiceal orifice and ileocecal                            valve. The ileocecal valve, appendiceal orifice,                            and rectum were photographed. The quality of the                            bowel preparation was excellent. The colonoscopy                            was performed without difficulty. The patient                            tolerated the procedure well. Scope In: 3:05:52 PM Scope Out: 3:20:24 PM Scope Withdrawal Time: 0 hours 12 minutes 29 seconds  Total Procedure Duration: 0 hours 14 minutes 32 seconds  Findings:                 The perianal and digital rectal examinations were                            normal.                           Four sessile polyps were found in the sigmoid colon                            (1), transverse colon (1) and cecum (2). The polyps                            were 6 to 8 mm in size. These polyps were removed                            with a cold snare. Resection and retrieval were                            complete.  Scattered small-mouthed diverticula were found in                            the entire colon. There was no evidence of                            diverticular bleeding.                           Internal hemorrhoids were found during                            retroflexion. The hemorrhoids were small and Grade                            I (internal hemorrhoids that do not prolapse).                           The exam was otherwise without abnormality on                            direct and retroflexion views. Complications:            No immediate complications. Estimated blood loss:                            None. Estimated Blood Loss:     Estimated blood loss: none. Impression:               - Four 6 to 8 mm polyps in the sigmoid colon, in                            the transverse colon and in the cecum, removed with                            a cold snare. Resected and  retrieved.                           - Mild diverticulosis in the entire examined colon.                            There was no evidence of diverticular bleeding.                           - Internal hemorrhoids.                           - The examination was otherwise normal on direct                            and retroflexion views. Recommendation:           - Repeat colonoscopy in 3 - 5 years for                            surveillance pending path review.                           -  Patient has a contact number available for                            emergencies. The signs and symptoms of potential                            delayed complications were discussed with the                            patient. Return to normal activities tomorrow.                            Written discharge instructions were provided to the                            patient.                           - High fiber diet.                           - Continue present medications.                           - Await pathology results. Ladene Artist, MD 02/02/2018 3:37:20 PM This report has been signed electronically.

## 2018-02-02 NOTE — Progress Notes (Signed)
I have reviewed the patient's medical history in detail and updated the computerized patient record.

## 2018-02-03 ENCOUNTER — Telehealth: Payer: Self-pay

## 2018-02-03 NOTE — Telephone Encounter (Signed)
  Follow up Call-  Call Shannon [name] number 02/02/2018  Permission to leave phone message Yes  Some recent data might be hidden     Patient questions:  Do you have a fever, pain , or abdominal swelling? No. Pain Score  0 *  Have you tolerated food without any problems? Yes.    Have you been able to return to your normal activities? Yes.    Do you have any questions about your discharge instructions: Diet   No. Medications  No. Follow up visit  No.  Do you have questions or concerns about your Care? No.  Actions: * If pain score is 4 or above: No action needed, pain <4.

## 2018-02-09 ENCOUNTER — Encounter: Payer: Self-pay | Admitting: Gastroenterology

## 2019-06-15 ENCOUNTER — Ambulatory Visit (INDEPENDENT_AMBULATORY_CARE_PROVIDER_SITE_OTHER): Payer: Medicare HMO | Admitting: Family Medicine

## 2019-06-15 ENCOUNTER — Other Ambulatory Visit: Payer: Self-pay

## 2019-06-15 ENCOUNTER — Encounter: Payer: Self-pay | Admitting: Family Medicine

## 2019-06-15 VITALS — BP 144/96 | HR 102 | Temp 98.4°F | Resp 12 | Ht <= 58 in | Wt 122.0 lb

## 2019-06-15 DIAGNOSIS — B86 Scabies: Secondary | ICD-10-CM | POA: Diagnosis not present

## 2019-06-15 DIAGNOSIS — L299 Pruritus, unspecified: Secondary | ICD-10-CM | POA: Diagnosis not present

## 2019-06-15 MED ORDER — PERMETHRIN 5 % EX CREA: 1.0000 "application " | TOPICAL_CREAM | Freq: Once | CUTANEOUS | 0 refills | Status: AC

## 2019-06-15 MED ORDER — HYDROXYZINE HCL 25 MG PO TABS: 25.0000 mg | ORAL_TABLET | ORAL | 5 refills | Status: DC | PRN

## 2019-06-15 NOTE — Patient Instructions (Signed)
Scabies, Adult  Scabies is a skin condition that happens when very small insects get under the skin (infestation). This causes a rash and severe itchiness. Scabies can spread from person to person (is contagious). If you get scabies, it is common for others in your household to get scabies too. With proper treatment, symptoms usually go away in 2-4 weeks. Scabies usually does not cause lasting problems. What are the causes? This condition is caused by tiny mites (Sarcoptes scabiei, or human itch mites) that can only be seen with a microscope. The mites get into the top layer of skin and lay eggs. Scabies can spread from person to person through:  Close contact with a person who has scabies.  Sharing or having contact with infested items, such as towels, bedding, or clothing. What increases the risk? The following factors may make you more likely to develop this condition:  Living in a nursing home or other extended care facility.  Having sexual contact with a partner who has scabies.  Caring for others who are at increased risk for scabies. What are the signs or symptoms? Symptoms of this condition include:  Severe itchiness. This is often worse at night.  A rash that includes tiny red bumps or blisters. The rash commonly occurs on the hands, wrists, elbows, armpits, chest, waist, groin, or buttocks. The bumps may form a line (burrow) in some areas.  Skin irritation. This can include scaly patches or sores. How is this diagnosed? This condition may be diagnosed based on:  A physical exam of the skin.  A skin test. Your health care provider may take a sample of your affected skin (skin scraping) and have it examined under a microscope for signs of mites. How is this treated? This condition may be treated with:  Medicated cream or lotion that kills the mites. This is spread on the entire body and left on for several hours. Usually, one treatment with medicated cream or lotion is  enough to kill all the mites. In severe cases, the treatment may need to be repeated.  Medicated cream that relieves itching.  Medicines taken by mouth (orally) that: ? Relieve itching. ? Reduce the swelling and redness. ? Kill the mites. This treatment may be done in severe cases. Follow these instructions at home: Medicines   Take or apply over-the-counter and prescription medicines as told by your health care provider.  Apply medicated cream or lotion as told by your health care provider.  Do not wash off the medicated cream or lotion until the necessary amount of time has passed. Skin care   Avoid scratching the affected areas of your skin.  Keep your fingernails closely trimmed to reduce injury from scratching.  Take cool baths or apply cool washcloths to your skin to help reduce itching. General instructions  Clean all items that you recently had contact with, including bedding, clothing, and furniture. Do this on the same day that you start treatment. ? Dry clean items, or use hot water to wash items. Dry items on the hot dry cycle. ? Place items that cannot be washed into closed, airtight plastic bags for at least 3 days. The mites cannot live for more than 3 days away from human skin. ? Vacuum furniture and mattresses that you use.  Make sure that other people who may have been infested are examined by a health care provider. These include members of your household and anyone who may have had contact with infested items.  Keep all follow-up  visits as told by your health care provider. This is important. Contact a health care provider if:  You have itching that does not go away after 4 weeks of treatment.  You continue to develop new bumps or burrows.  You have redness, swelling, or pain in your rash area after treatment.  You have fluid, blood, or pus coming from your rash. Summary  Scabies is a skin condition that causes a rash and severe itchiness.  This  condition is caused by tiny mites that get into the top layer of the skin and lay eggs.  Scabies can spread from person to person.  Follow treatments as recommended by your health care provider.  Clean all items that you recently had contact with. This information is not intended to replace advice given to you by your health care provider. Make sure you discuss any questions you have with your health care provider. Document Released: 04/04/2015 Document Revised: 05/19/2018 Document Reviewed: 05/19/2018 Elsevier Patient Education  2020 [employer]  

## 2019-06-15 NOTE — Progress Notes (Signed)
Established Patient Office Visit  Subjective:  Patient ID: Shannon [name], female    DOB: 1951/04/07  Age: 68 y.o. MRN: [id number]  CC:  Chief Complaint  Patient presents with  . Transitions Of Care    pt stated physical check up  . Rash    pt stated having red rash, itching both hands and left legs especially at night--1 months    HPI Shannon [name] presents for  Visit for transition of care from Shannon Cheadle MD She will need a physical with Shannon [name] who will be her PCP  She has a rash on her body that has been on her hands and leges  Itchy  Worse at night She gardens often and thinks it might have been from gardening This has been on going for a month  Past Medical History:  Diagnosis Date  . Heat rash    with itching    Past Surgical History:  Procedure Laterality Date  . NO PAST SURGERIES      Family History  Problem Relation Age of Onset  . Other Mother     Social History   Socioeconomic History  . Marital status: Married    Spouse name: Not on file  . Number of children: Not on file  . Years of education: Not on file  . Highest education level: Not on file  Occupational History  . Not on file  Social Needs  . Financial resource strain: Not on file  . Food insecurity    Worry: Not on file    Inability: Not on file  . Transportation needs    Medical: Not on file    Non-medical: Not on file  Tobacco Use  . Smoking status: Never Smoker  . Smokeless tobacco: Never Used  Substance and Sexual Activity  . Alcohol use: No  . Drug use: No  . Sexual activity: Yes  Lifestyle  . Physical activity    Days per week: Not on file    Minutes per session: Not on file  . Stress: Not on file  Relationships  . Social Herbalist on phone: Not on file    Gets together: Not on file    Attends religious service: Not on file    Active member of club or organization: Not on file    Attends meetings of clubs or organizations: Not on file   Relationship status: Not on file  . Intimate partner violence    Fear of current or ex partner: Not on file    Emotionally abused: Not on file    Physically abused: Not on file    Forced sexual activity: Not on file  Other Topics Concern  . Not on file  Social History Narrative  . Not on file    Outpatient Medications Prior to Visit  Medication Sig Dispense Refill  . alendronate (FOSAMAX) 70 MG tablet Take 1 tablet (70 mg total) by mouth every 7 (seven) days. Take with a full glass of water on an empty stomach. 12 tablet 4  . levocetirizine (XYZAL) 5 MG tablet Take 1 tablet (5 mg total) by mouth every evening. 90 tablet 3  . Multiple Vitamins-Calcium (ONE-A-DAY WOMENS PO) Take by mouth daily.    . hydrOXYzine (ATARAX/VISTARIL) 25 MG tablet Take 1-2 tablets (25-50 mg total) by mouth every 4 (four) hours as needed for itching (rash). 60 tablet 5   Facility-Administered Medications Prior to Visit  Medication Dose Route Frequency Provider Last Rate Last Dose  .  0.9 %  sodium chloride infusion  500 mL Intravenous Once Ladene Artist, MD        No Known Allergies  ROS Review of Systems Review of Systems  Constitutional: Negative for activity change, appetite change, chills and fever.  HENT: Negative for congestion, nosebleeds, trouble swallowing and voice change.   Respiratory: Negative for cough, shortness of breath and wheezing.   Gastrointestinal: Negative for diarrhea, nausea and vomiting.  Genitourinary: Negative for difficulty urinating, dysuria, flank pain and hematuria.  Musculoskeletal: Negative for back pain, joint swelling and neck pain.  Neurological: Negative for dizziness, speech difficulty, light-headedness and numbness.  See HPI. All other review of systems negative.     Objective:      Vitals:   06/15/19 1014 06/15/19 1031  BP: (!) 157/82 (!) 144/96  Pulse: (!) 102   Resp: 12   Temp: 98.4 F (36.9 C)   SpO2: 98%   Weight: 122 lb (55.3 kg)   Height: 4'  9" (1.448 m)     Wt Readings from Last 3 Encounters:  06/15/19 122 lb (55.3 kg)  02/02/18 119 lb (54 kg)  01/12/18 119 lb 9.6 oz (54.3 kg)   Physical Exam  Constitutional: Oriented to person, place, and time. Appears well-developed and well-nourished.  HENT:  Head: Normocephalic and atraumatic.  Eyes: Conjunctivae and EOM are normal.  Cardiovascular: Normal rate, regular rhythm, normal heart sounds and intact distal pulses.  No murmur heard. Pulmonary/Chest: Effort normal and breath sounds normal. No stridor. No respiratory distress. Has no wheezes.  Neurological: Is alert and oriented to person, place, and time.  Skin: Skin is warm. Capillary refill takes less than 2 seconds.  Psychiatric: Has a normal mood and affect. Behavior is normal. Judgment and thought content normal.   Physical Exam  Skin: Rash noted. No burn, no ecchymosis, no laceration, no lesion and no petechiae noted. Rash is macular and nodular.  noted to have crusted macules on the hands, forearm, legs Lesions are discrete and do not seems to streak or coalesce     There are no preventive care reminders to display for this patient.  There are no preventive care reminders to display for this patient.  Lab Results  Component Value Date   TSH 3.090 12/03/2017   Lab Results  Component Value Date   WBC 6.7 12/03/2017   HGB 12.3 12/03/2017   HCT 38.5 12/03/2017   MCV 84 12/03/2017   PLT 276 12/03/2017   Lab Results  Component Value Date   NA 142 12/03/2017   K 3.8 12/03/2017   CO2 25 12/03/2017   GLUCOSE 94 12/03/2017   BUN 12 12/03/2017   CREATININE 0.68 12/03/2017   BILITOT 0.3 12/03/2017   ALKPHOS 89 12/03/2017   AST 22 12/03/2017   ALT 20 12/03/2017   PROT 7.6 12/03/2017   ALBUMIN 4.4 12/03/2017   CALCIUM 9.8 12/03/2017   Lab Results  Component Value Date   CHOL 259 (H) 12/03/2017   Lab Results  Component Value Date   HDL 43 12/03/2017   Lab Results  Component Value Date   LDLCALC  146 (H) 12/03/2017   Lab Results  Component Value Date   TRIG 348 (H) 12/03/2017   Lab Results  Component Value Date   CHOLHDL 6.0 (H) 12/03/2017   No results found for: HGBA1C    Assessment & Plan:   Problem List Items Addressed This Visit    None    Visit Diagnoses    Pruritus    -  Primary   Scabies        likely scabies Pt gardens and may have been exposed that way Will treat topically She will follow up for physical and re-evaluation   Meds ordered this encounter  Medications  . permethrin (ELIMITE) 5 % cream    Sig: Apply 1 application topically once for 1 dose. Topical: Cream 5%: Apply to entire body; leave on for 8 to 14 hours before washing off with water. Repeat this regimen in one week if symptoms persist.    Dispense:  60 g    Refill:  0  . hydrOXYzine (ATARAX/VISTARIL) 25 MG tablet    Sig: Take 1-2 tablets (25-50 mg total) by mouth every 4 (four) hours as needed for itching (rash).    Dispense:  60 tablet    Refill:  5    Follow-up: Return for schedule physical exam with Shannon [name].    Forrest Moron, MD

## 2019-06-22 ENCOUNTER — Encounter: Payer: Self-pay | Admitting: Adult Health Nurse Practitioner

## 2019-06-22 ENCOUNTER — Other Ambulatory Visit: Payer: Self-pay

## 2019-06-22 ENCOUNTER — Ambulatory Visit (INDEPENDENT_AMBULATORY_CARE_PROVIDER_SITE_OTHER): Payer: Medicare HMO | Admitting: Adult Health Nurse Practitioner

## 2019-06-22 VITALS — BP 169/89 | HR 96 | Temp 97.7°F | Resp 16 | Ht <= 58 in | Wt 120.0 lb

## 2019-06-22 DIAGNOSIS — Z1231 Encounter for screening mammogram for malignant neoplasm of breast: Secondary | ICD-10-CM | POA: Diagnosis not present

## 2019-06-22 DIAGNOSIS — Z0000 Encounter for general adult medical examination without abnormal findings: Secondary | ICD-10-CM | POA: Diagnosis not present

## 2019-06-22 DIAGNOSIS — M810 Age-related osteoporosis without current pathological fracture: Secondary | ICD-10-CM | POA: Diagnosis not present

## 2019-06-22 DIAGNOSIS — Z1321 Encounter for screening for nutritional disorder: Secondary | ICD-10-CM | POA: Diagnosis not present

## 2019-06-22 DIAGNOSIS — L508 Other urticaria: Secondary | ICD-10-CM

## 2019-06-22 DIAGNOSIS — E7800 Pure hypercholesterolemia, unspecified: Secondary | ICD-10-CM | POA: Diagnosis not present

## 2019-06-22 DIAGNOSIS — Z1211 Encounter for screening for malignant neoplasm of colon: Secondary | ICD-10-CM

## 2019-06-22 DIAGNOSIS — Z0001 Encounter for general adult medical examination with abnormal findings: Secondary | ICD-10-CM | POA: Diagnosis not present

## 2019-06-22 LAB — POCT URINALYSIS DIP (MANUAL ENTRY)
Bilirubin, UA: NEGATIVE
Blood, UA: NEGATIVE
Glucose, UA: NEGATIVE mg/dL
Ketones, POC UA: NEGATIVE mg/dL
Leukocytes, UA: NEGATIVE
Nitrite, UA: NEGATIVE
Protein Ur, POC: NEGATIVE mg/dL
Spec Grav, UA: 1.015 (ref 1.010–1.025)
Urobilinogen, UA: 0.2 E.U./dL
pH, UA: 7 (ref 5.0–8.0)

## 2019-06-22 MED ORDER — LOSARTAN POTASSIUM 25 MG PO TABS: 25.0000 mg | ORAL_TABLET | Freq: Every day | ORAL | 1 refills | Status: DC

## 2019-06-22 MED ORDER — METHYLPREDNISOLONE 4 MG PO TBPK: ORAL_TABLET | ORAL | 0 refills | Status: DC

## 2019-06-22 MED ORDER — SIMVASTATIN 10 MG PO TABS: 10.0000 mg | ORAL_TABLET | Freq: Every day | ORAL | 1 refills | Status: DC

## 2019-06-22 MED ORDER — ALENDRONATE SODIUM 70 MG PO TABS: 70.0000 mg | ORAL_TABLET | ORAL | 4 refills | Status: DC

## 2019-06-22 NOTE — Patient Instructions (Addendum)
.medi     If you have lab work done today you will be contacted with your lab results within the next 2 weeks.  If you have not heard from [place] then please contact us. The fastest way to get your results is to register for My Chart.   IF you received an x-ray today, you will receive an invoice from [facility]. Please contact [facility] at [phone] with questions or concerns regarding your invoice.   IF you received labwork today, you will receive an invoice from [facility]. Please contact [facility] at [phone] with questions or concerns regarding your invoice.   Our billing staff will not be able to assist you with questions regarding bills from these companies.  You will be contacted with the lab results as soon as they are available. The fastest way to get your results is to activate your My Chart account. Instructions are located on the last page of this paperwork. If you have not heard from [place] regarding the results in 2 weeks, please contact this office.      Managing Your Hypertension Hypertension is commonly called high blood pressure. This is when the force of your blood pressing against the walls of your arteries is too strong. Arteries are blood vessels that carry blood from your heart throughout your body. Hypertension forces the heart to work harder to pump blood, and may cause the arteries to become narrow or stiff. Having untreated or uncontrolled hypertension can cause heart attack, stroke, kidney disease, and other problems. What are blood pressure readings? A blood pressure reading consists of a higher number over a lower number. Ideally, your blood pressure should be below 120/80. The first ("top") number is called the systolic pressure. It is a measure of the pressure in your arteries as your heart beats. The second ("bottom") number is called the diastolic pressure. It is a measure of the pressure in your arteries as the heart relaxes. What does my  blood pressure reading mean? Blood pressure is classified into four stages. Based on your blood pressure reading, your health care provider may use the following stages to determine what type of treatment you need, if any. Systolic pressure and diastolic pressure are measured in a unit called mm Hg. Normal  Systolic pressure: below 123456.  Diastolic pressure: below 80. Elevated  Systolic pressure: [id number].  Diastolic pressure: below 80. Hypertension stage 1  Systolic pressure: 0000000.  Diastolic pressure: [id number]. Hypertension stage 2  Systolic pressure: [id number] or above.  Diastolic pressure: 90 or above. What health risks are associated with hypertension? Managing your hypertension is an important responsibility. Uncontrolled hypertension can lead to:  A heart attack.  A stroke.  A weakened blood vessel (aneurysm).  Heart failure.  Kidney damage.  Eye damage.  Metabolic syndrome.  Memory and concentration problems. What changes can I make to manage my hypertension? Hypertension can be managed by making lifestyle changes and possibly by taking medicines. Your health care provider will help you make a plan to bring your blood pressure within a normal range. Eating and drinking   Eat a diet that is high in fiber and potassium, and low in salt (sodium), added sugar, and fat. An example eating plan is called the DASH (Dietary Approaches to Stop Hypertension) diet. To eat this way: ? Eat plenty of fresh fruits and vegetables. Try to fill half of your plate at each meal with fruits and vegetables. ? Eat whole grains, such as whole wheat pasta, brown rice, or  whole grain bread. Fill about one quarter of your plate with whole grains. ? Eat low-fat diary products. ? Avoid fatty cuts of meat, processed or cured meats, and poultry with skin. Fill about one quarter of your plate with lean proteins such as fish, chicken without skin, beans, eggs, and tofu. ? Avoid premade and processed  foods. These tend to be higher in sodium, added sugar, and fat.  Reduce your daily sodium intake. Most people with hypertension should eat less than 1,500 mg of sodium a day.  Limit alcohol intake to no more than 1 drink a day for nonpregnant women and 2 drinks a day for men. One drink equals 12 oz of beer, 5 oz of wine, or 1 oz of hard liquor. Lifestyle  Work with your health care provider to maintain a healthy body weight, or to lose weight. Ask what an ideal weight is for you.  Get at least 30 minutes of exercise that causes your heart to beat faster (aerobic exercise) most days of the week. Activities may include walking, swimming, or biking.  Include exercise to strengthen your muscles (resistance exercise), such as weight lifting, as part of your weekly exercise routine. Try to do these types of exercises for 30 minutes at least 3 days a week.  Do not use any products that contain nicotine or tobacco, such as cigarettes and e-cigarettes. If you need help quitting, ask your health care provider.  Control any long-term (chronic) conditions you have, such as high cholesterol or diabetes. Monitoring  Monitor your blood pressure at home as told by your health care provider. Your personal target blood pressure may vary depending on your medical conditions, your age, and other factors.  Have your blood pressure checked regularly, as often as told by your health care provider. Working with your health care provider  Review all the medicines you take with your health care provider because there may be side effects or interactions.  Talk with your health care provider about your diet, exercise habits, and other lifestyle factors that may be contributing to hypertension.  Visit your health care provider regularly. Your health care provider can help you create and adjust your plan for managing hypertension. Will I need medicine to control my blood pressure? Your health care provider may  prescribe medicine if lifestyle changes are not enough to get your blood pressure under control, and if:  Your systolic blood pressure is 130 or higher.  Your diastolic blood pressure is 80 or higher. Take medicines only as told by your health care provider. Follow the directions carefully. Blood pressure medicines must be taken as prescribed. The medicine does not work as well when you skip doses. Skipping doses also puts you at risk for problems. Contact a health care provider if:  You think you are having a reaction to medicines you have taken.  You have repeated (recurrent) headaches.  You feel dizzy.  You have swelling in your ankles.  You have trouble with your vision. Get help right away if:  You develop a severe headache or confusion.  You have unusual weakness or numbness, or you feel faint.  You have severe pain in your chest or abdomen.  You vomit repeatedly.  You have trouble breathing. Summary  Hypertension is when the force of blood pumping through your arteries is too strong. If this condition is not controlled, it may put you at risk for serious complications.  Your personal target blood pressure may vary depending on your medical conditions,  your age, and other factors. For most people, a normal blood pressure is less than 120/80.  Hypertension is managed by lifestyle changes, medicines, or both. Lifestyle changes include weight loss, eating a healthy, low-sodium diet, exercising more, and limiting alcohol. This information is not intended to replace advice given to you by your health care provider. Make sure you discuss any questions you have with your health care provider. Document Released: 04/07/2012 Document Revised: 11/05/2018 Document Reviewed: 06/11/2016 Elsevier Patient Education  2020 [place].    Ms. Manfull , Thank you for taking time to come for your Medicare Wellness Visit. I appreciate your ongoing commitment to your health goals.  Please review the following plan we discussed and let me know if I can assist you in the future.   These are the goals we discussed: Goals   None     This is a list of the screening recommended for you and due dates:  Health Maintenance  Topic Date Due  . Mammogram  01/26/2020  . Colon Cancer Screening  02/02/2021  . Tetanus Vaccine  05/12/2024  . Flu Shot  Completed  . DEXA scan (bone density measurement)  Completed  .  Hepatitis C: One time screening is recommended by Center for Disease Control  (CDC) for  adults born from [birth year] through 1965.   Completed  . Pneumonia vaccines  Completed

## 2019-06-23 LAB — CMP14+EGFR
ALT: 14 IU/L (ref 0–32)
AST: 19 IU/L (ref 0–40)
Albumin/Globulin Ratio: 1.5 (ref 1.2–2.2)
Albumin: 4.5 g/dL (ref 3.8–4.8)
Alkaline Phosphatase: 82 IU/L (ref 39–117)
BUN/Creatinine Ratio: 17 (ref 12–28)
BUN: 11 mg/dL (ref 8–27)
Bilirubin Total: 0.4 mg/dL (ref 0.0–1.2)
CO2: 24 mmol/L (ref 20–29)
Calcium: 9.6 mg/dL (ref 8.7–10.3)
Chloride: 100 mmol/L (ref 96–106)
Creatinine, Ser: 0.65 mg/dL (ref 0.57–1.00)
GFR calc Af Amer: 105 mL/min/{1.73_m2} (ref >59)
GFR calc non Af Amer: 91 mL/min/{1.73_m2} (ref >59)
Globulin, Total: 3.1 g/dL (ref 1.5–4.5)
Glucose: 101 mg/dL — ABNORMAL HIGH (ref 65–99)
Potassium: 4.2 mmol/L (ref 3.5–5.2)
Sodium: 139 mmol/L (ref 134–144)
Total Protein: 7.6 g/dL (ref 6.0–8.5)

## 2019-06-23 LAB — LIPID PANEL
Chol/HDL Ratio: 5.2 ratio — ABNORMAL HIGH (ref 0.0–4.4)
Cholesterol, Total: 265 mg/dL — ABNORMAL HIGH (ref 100–199)
HDL: 51 mg/dL (ref >39)
LDL Chol Calc (NIH): 182 mg/dL — ABNORMAL HIGH (ref 0–99)
Triglycerides: 171 mg/dL — ABNORMAL HIGH (ref 0–149)
VLDL Cholesterol Cal: 32 mg/dL (ref 5–40)

## 2019-06-23 LAB — VITAMIN D 25 HYDROXY (VIT D DEFICIENCY, FRACTURES): Vit D, 25-Hydroxy: 24.1 ng/mL — ABNORMAL LOW (ref 30.0–100.0)

## 2019-06-23 LAB — TSH: TSH: 1.67 u[IU]/mL (ref 0.450–4.500)

## 2019-07-20 ENCOUNTER — Other Ambulatory Visit: Payer: Self-pay

## 2019-07-20 ENCOUNTER — Ambulatory Visit (INDEPENDENT_AMBULATORY_CARE_PROVIDER_SITE_OTHER): Payer: Medicare HMO | Admitting: Adult Health Nurse Practitioner

## 2019-07-20 ENCOUNTER — Encounter: Payer: Self-pay | Admitting: Adult Health Nurse Practitioner

## 2019-07-20 VITALS — HR 94 | Temp 98.2°F | Ht <= 58 in | Wt 121.4 lb

## 2019-07-20 DIAGNOSIS — Z0000 Encounter for general adult medical examination without abnormal findings: Secondary | ICD-10-CM | POA: Insufficient documentation

## 2019-07-20 DIAGNOSIS — L309 Dermatitis, unspecified: Secondary | ICD-10-CM

## 2019-07-20 DIAGNOSIS — Z86010 Personal history of colonic polyps: Secondary | ICD-10-CM | POA: Diagnosis not present

## 2019-07-20 DIAGNOSIS — E559 Vitamin D deficiency, unspecified: Secondary | ICD-10-CM | POA: Diagnosis not present

## 2019-07-20 DIAGNOSIS — L508 Other urticaria: Secondary | ICD-10-CM

## 2019-07-20 DIAGNOSIS — E7800 Pure hypercholesterolemia, unspecified: Secondary | ICD-10-CM | POA: Diagnosis not present

## 2019-07-20 DIAGNOSIS — I10 Essential (primary) hypertension: Secondary | ICD-10-CM | POA: Diagnosis not present

## 2019-07-20 HISTORY — DX: Personal history of colonic polyps: Z86.010

## 2019-07-20 HISTORY — DX: Dermatitis, unspecified: L30.9

## 2019-07-20 HISTORY — DX: Essential (primary) hypertension: I10

## 2019-07-20 HISTORY — DX: Vitamin D deficiency, unspecified: E55.9

## 2019-07-20 MED ORDER — CLOBETASOL PROP EMOLLIENT BASE 0.05 % EX CREA: 1.0000 "application " | TOPICAL_CREAM | Freq: Two times a day (BID) | CUTANEOUS | 0 refills | Status: AC

## 2019-07-20 MED ORDER — VITAMIN D (ERGOCALCIFEROL) 1.25 MG (50000 UNIT) PO CAPS: 50000.0000 [IU] | ORAL_CAPSULE | ORAL | 3 refills | Status: AC

## 2019-07-20 MED ORDER — LOSARTAN POTASSIUM-HCTZ 100-25 MG PO TABS: 1.0000 | ORAL_TABLET | Freq: Every day | ORAL | 3 refills | Status: DC

## 2019-07-20 NOTE — Progress Notes (Signed)
Subjective:    Patient ID: Shannon [name], female    DOB: 1951-06-18, 68 y.o.   MRN: [id number] Chief Complaint  Patient presents with   Annual Exam   Rash    follow up from 06/15/19, pt had scabies    HPI   Primary Preventative Screenings: Cervical Cancer: pap 11/11/13 nml with neg HPV at 68 yo. Pt may only have had the 1 pap smear (has 4 sons and lived in [place] since 1981 so if any were born here she likely has had) STI screening: No h/o and no risks Breast Cancer: Mammogram 01/2015 at breast center - right calcifications that were nml on further eval Colorectal Cancer: none, was [facility] Tobacco use: none Bone Density: refer for DEXA Cardiac: Weight/blood sugar: OTC/vit/supp/herbal: ca?? Vit D?? - one a day and fish oil, occ milk Diet/Exercise/EtOH/substances: goes to the gym 3x/wk and gardens, no EtOH. Dentist/Optho: none  Primary Preventative Screenings: Cervical Cancer:  Family Planning: STI screening: Breast Cancer: Colorectal Cancer: Tobacco use/EtOH/substances: Bone Density: Cardiac: Weight/Blood sugar/Diet/Exercise: BMI Readings from Last 3 Encounters:  07/20/19 30.39 kg/m  06/22/19 25.97 kg/m  06/15/19 26.40 kg/m   No results found for: HGBA1C OTC/Vit/Supp/Herbal: Dentist/Optho: Immunizations:  Immunization History  Administered Date(s) Administered   Hepatitis A 07/15/2004   Influenza,inj,Quad PF,6+ Mos 05/12/2014, 04/18/2016   Pneumococcal Conjugate-13 11/06/2015   Pneumococcal Polysaccharide-23 11/27/2016   Tdap 05/12/2014   Zoster 05/28/2016     Chronic Medical Conditions: Rash and itching for a long time but doesn't have any currently. Happens more when she is run. Is mainly around the neck and back and bothers her at night.  Prior medicine helped a little but not strong enough - will only go away for 3-4 hours in the exact same spot.  Is dry and itchy. Last thing we tried was clobetasol 18 mos  ago. Not taking ANY medication for otc other than prn benadryl, has tried zyrtec w/ same results - resolved for 3-4 hrs only.    Past Medical History:  Diagnosis Date   Heat rash    with itching   Past Surgical History:  Procedure Laterality Date   NO PAST SURGERIES     Current Outpatient Medications on File Prior to Visit  Medication Sig Dispense Refill   hydrOXYzine (ATARAX/VISTARIL) 25 MG tablet Take 1-2 tablets (25-50 mg total) by mouth every 4 (four) hours as needed for itching (rash). 60 tablet 5   levocetirizine (XYZAL) 5 MG tablet Take 1 tablet (5 mg total) by mouth every evening. 90 tablet 3   Multiple Vitamins-Calcium (ONE-A-DAY WOMENS PO) Take by mouth daily.     Current Facility-Administered Medications on File Prior to Visit  Medication Dose Route Frequency Provider Last Rate Last Admin   0.9 %  sodium chloride infusion  500 mL Intravenous Once Ladene Artist, MD       No Known Allergies Family History  Problem Relation Age of Onset   Other Mother    Social History   Socioeconomic History   Marital status: Married    Spouse name: Not on file   Number of children: Not on file   Years of education: Not on file   Highest education level: Not on file  Occupational History   Not on file  Tobacco Use   Smoking status: Never Smoker   Smokeless tobacco: Never Used  Substance and Sexual Activity   Alcohol use: No   Drug use: No   Sexual  activity: Yes  Other Topics Concern   Not on file  Social History Narrative   Not on file   Social Determinants of Health   Financial Resource Strain:    Difficulty of Paying Living Expenses: Not on file  Food Insecurity:    Worried About [place] in the Last Year: Not on file   Ran Out of Food in the Last Year: Not on file  Transportation Needs:    Lack of Transportation (Medical): Not on file   Lack of Transportation (Non-Medical): Not on file  Physical Activity:    Days of Exercise  per Week: Not on file   Minutes of Exercise per Session: Not on file  Stress:    Feeling of Stress : Not on file  Social Connections:    Frequency of Communication with Friends and Family: Not on file   Frequency of Social Gatherings with Friends and Family: Not on file   Attends Religious Services: Not on file   Active Member of Clubs or Organizations: Not on file   Attends Club or Organization Meetings: Not on file   Marital Status: Not on file   Depression screen [facility] 2/9 07/20/2019 06/22/2019 06/15/2019 12/03/2017 11/27/2016  Decreased Interest 0 0 0 0 0  Down, Depressed, Hopeless 0 0 0 0 0  PHQ - 2 Score 0 0 0 0 0       Review of Systems  Skin: Positive for rash.   See hpi    Objective:   Physical Exam Constitutional:      General: She is not in acute distress.    Appearance: She is well-developed. She is not diaphoretic.  HENT:     Head: Normocephalic and atraumatic.     Right Ear: External ear normal.     Left Ear: External ear normal.  Eyes:     General: No scleral icterus.    Conjunctiva/sclera: Conjunctivae normal.  Neck:     Thyroid: No thyromegaly.  Cardiovascular:     Rate and Rhythm: Normal rate and regular rhythm.     Heart sounds: Normal heart sounds.  Pulmonary:     Effort: Pulmonary effort is normal. No respiratory distress.     Breath sounds: Normal breath sounds.  Musculoskeletal:     Cervical back: Normal range of motion and neck supple.  Lymphadenopathy:     Cervical: No cervical adenopathy.  Skin:    General: Skin is warm and dry.     Findings: No erythema.  Neurological:     Mental Status: She is alert and oriented to person, place, and time.  Psychiatric:        Behavior: Behavior normal.   BP (!) 169/89    Pulse 96    Temp 97.7 F (36.5 C) (Oral)    Resp 16    Ht '4\' 9"'  (1.448 m)    Wt 120 lb (54.4 kg)    SpO2 99%    BMI 25.97 kg/m      Assessment & Plan:   1. Medicare annual wellness visit, subsequent   2. Age-related  osteoporosis without current pathological fracture   3. Annual physical exam   4. Chronic urticaria   5. Pure hypercholesterolemia   6. Encounter for screening mammogram for breast cancer   7. Encounter for vitamin deficiency screening   8. Special screening for malignant neoplasms, colon     Orders Placed This Encounter  Procedures   MM Digital Screening    Standing Status:  Future    Standing Expiration Date:   08/21/2020    Order Specific Question:   Reason for Exam (SYMPTOM  OR DIAGNOSIS REQUIRED)    Answer:   screen breast mammo    Order Specific Question:   Preferred imaging location?    Answer:   External   Lipid panel   CMP14+EGFR   TSH   VITAMIN D 25 Hydroxy (Vit-D Deficiency, Fractures)   POCT urinalysis dipstick    Meds ordered this encounter  Medications   simvastatin (ZOCOR) 10 MG tablet    Sig: Take 1 tablet (10 mg total) by mouth at bedtime.    Dispense:  30 tablet    Refill:  1   losartan (COZAAR) 25 MG tablet    Sig: Take 1 tablet (25 mg total) by mouth daily.    Dispense:  30 tablet    Refill:  1   alendronate (FOSAMAX) 70 MG tablet    Sig: Take 1 tablet (70 mg total) by mouth every 7 (seven) days. Take with a full glass of water on an empty stomach.    Dispense:  12 tablet    Refill:  4   methylPREDNISolone (MEDROL DOSEPAK) 4 MG TBPK tablet    Sig: As directed on pkg label    Dispense:  21 each    Refill:  0    Delman Cheadle, MD, MPH [facility] [place], [place]  [address] [phone] Office phone  [phone] Office fax   07/20/19 1:51 PM

## 2019-07-20 NOTE — Patient Instructions (Signed)
° ° ° °  If you have lab work done today you will be contacted with your lab results within the next 2 weeks.  If you have not heard from us then please contact us. The fastest way to get your results is to register for My Chart. ° ° °IF you received an x-ray today, you will receive an invoice from [facility]. Please contact [facility] at [phone] with questions or concerns regarding your invoice.  ° °IF you received labwork today, you will receive an invoice from [facility]. Please contact [facility] at [phone] with questions or concerns regarding your invoice.  ° °Our billing staff will not be able to assist you with questions regarding bills from these companies. ° °You will be contacted with the lab results as soon as they are available. The fastest way to get your results is to activate your My Chart account. Instructions are located on the last page of this paperwork. If you have not heard from us regarding the results in 2 weeks, please contact this office. °  ° ° ° °

## 2019-07-20 NOTE — Progress Notes (Signed)
Subjective:   Shannon [name] is a 68 y.o. female who presents for Medicare Annual (Subsequent) preventive examination.  Review of Systems:  General ROS: negative    Review of Systems  Constitutional: Negative for activity change, appetite change, chills and fever.  HENT: Negative for congestion, nosebleeds, trouble swallowing and voice change.   Respiratory: Negative for cough, shortness of breath and wheezing.   Gastrointestinal: Negative for diarrhea, nausea and vomiting.  Genitourinary: Negative for difficulty urinating, dysuria, flank pain and hematuria.  Musculoskeletal: Negative for back pain, joint swelling and neck pain.  Neurological: Negative for dizziness, speech difficulty, light-headedness and numbness.  See HPI. All other review of systems negative.       Objective:     Vitals: BP (!) 169/89   Pulse 96   Temp 97.7 F (36.5 C) (Oral)   Resp 16   Ht 4\' 9"  (1.448 m)   Wt 120 lb (54.4 kg)   SpO2 99%   BMI 25.97 kg/m   Body mass index is 25.97 kg/m.  No flowsheet data found.  Tobacco Social History   Tobacco Use  Smoking Status Never Smoker  Smokeless Tobacco Never Used     Counseling given: Not Answered   Physical Exam:  General appearance - alert, well appearing, and in no distress  Mental Status - alert, oriented to person, place, and time  Eyes - pupils equal and reactive, extraocular eye movements intact   Ears - bilateral TM's and external ear canals normal   Nose - normal and patent, no erythema, discharge or polyps   Sinuses - Normal paranasal sinuses without tenderness   Throat - mucous membranes moist, pharynx normal without lesions   Neck - supple, no significant adenopathy   Thyroid - thyroid is normal in size without nodules or tenderness    Chest - clear to auscultation, no wheezes, rales or rhonchi, symmetric air entry   Heart - normal rate, regular rhythm, normal S1, S2, no murmurs, rubs, clicks or gallops   Abdomen  - soft, nontender, nondistended, no masses or organomegaly   Breasts - breasts appear normal, no suspicious masses, no skin or nipple changes or axillary nodes   Pelvic - normal external genitalia, vulva, vagina, cervix, uterus and adnexa   Rectal - negative without mass, lesions or tenderness   Back exam - full range of motion, no tenderness, palpable spasm or pain on motion   Neurological - alert, oriented, normal speech, no focal findings or movement disorder noted   Musculoskeletal - no joint tenderness, deformity or swelling   Extremities - peripheral pulses normal, no pedal edema, no clubbing or cyanosis   Skin - normal coloration and turgor, no rashes, no suspicious skin lesions noted   Past Medical History:  Diagnosis Date  . Heat rash    with itching   Past Surgical History:  Procedure Laterality Date  . NO PAST SURGERIES     Family History  Problem Relation Age of Onset  . Other Mother    Social History   Socioeconomic History  . Marital status: Married    Spouse name: Not on file  . Number of children: Not on file  . Years of education: Not on file  . Highest education level: Not on file  Occupational History  . Not on file  Tobacco Use  . Smoking status: Never Smoker  . Smokeless tobacco: Never Used  Substance and Sexual Activity  . Alcohol use: No  . Drug use: No  .  Sexual activity: Yes  Other Topics Concern  . Not on file  Social History Narrative  . Not on file   Social Determinants of Health   Financial Resource Strain:   . Difficulty of Paying Living Expenses: Not on file  Food Insecurity:   . Worried About Char[employer] fundraiser in the Last Year: Not on file  . Ran Out of Food in the Last Year: Not on file  Transportation Needs:   . Lack of Transportation (Medical): Not on file  . Lack of Transportation (Non-Medical): Not on file  Physical Activity:   . Days of Exercise per Week: Not on file  . Minutes of Exercise per Session: Not on  file  Stress:   . Feeling of Stress : Not on file  Social Connections:   . Frequency of Communication with Friends and Family: Not on file  . Frequency of Social Gatherings with Friends and Family: Not on file  . Attends Religious Services: Not on file  . Active Member of Clubs or Organizations: Not on file  . Attends Archivist Meetings: Not on file  . Marital Status: Not on file    Outpatient Encounter Medications as of 06/22/2019  Medication Sig  . alendronate (FOSAMAX) 70 MG tablet Take 1 tablet (70 mg total) by mouth every 7 (seven) days. Take with a full glass of water on an empty stomach.  . hydrOXYzine (ATARAX/VISTARIL) 25 MG tablet Take 1-2 tablets (25-50 mg total) by mouth every 4 (four) hours as needed for itching (rash).  Marland Kitchen levocetirizine (XYZAL) 5 MG tablet Take 1 tablet (5 mg total) by mouth every evening.  . Multiple Vitamins-Calcium (ONE-A-DAY WOMENS PO) Take by mouth daily.  . [DISCONTINUED] alendronate (FOSAMAX) 70 MG tablet Take 1 tablet (70 mg total) by mouth every 7 (seven) days. Take with a full glass of water on an empty stomach.  . losartan (COZAAR) 25 MG tablet Take 1 tablet (25 mg total) by mouth daily.  . methylPREDNISolone (MEDROL DOSEPAK) 4 MG TBPK tablet As directed on pkg label  . simvastatin (ZOCOR) 10 MG tablet Take 1 tablet (10 mg total) by mouth at bedtime.   Facility-Administered Encounter Medications as of 06/22/2019  Medication  . 0.9 %  sodium chloride infusion    Activities of Daily Living No flowsheet data found.  Patient Care Team: Wendall Mola, NP as PCP - General (Adult Health Nurse Practitioner)    Assessment:   This is a routine wellness examination for Shannon [name].  E Fall Risk Fall Risk  07/20/2019 06/22/2019 06/15/2019 12/03/2017 11/27/2016  Falls in the past year? 0 0 0 No No  Number falls in past yr: 0 0 0 - -  Injury with Fall? 0 0 0 - -  Follow up Falls evaluation completed - - - -      Depression  Screen PHQ 2/9 Scores 07/20/2019 06/22/2019 06/15/2019 12/03/2017  PHQ - 2 Score 0 0 0 0     Cognitive Function     6CIT Screen 06/15/2019  What Year? 0 points  What month? 0 points  What time? 0 points  Count back from 20 2 points  Months in reverse 2 points  Repeat phrase 10 points  Total Score 14    Immunization History  Administered Date(s) Administered  . Hepatitis A 07/15/2004  . Influenza,inj,Quad PF,6+ Mos 05/12/2014, 04/18/2016  . Pneumococcal Conjugate-13 11/06/2015  . Pneumococcal Polysaccharide-23 11/27/2016  . Tdap 05/12/2014  . Zoster 05/28/2016  Screening Tests Health Maintenance  Topic Date Due  . MAMMOGRAM  01/26/2020  . COLONOSCOPY  02/02/2021  . TETANUS/TDAP  05/12/2024  . INFLUENZA VACCINE  Completed  . DEXA SCAN  Completed  . Hepatitis C Screening  Completed  . PNA vac Low Risk Adult  Completed       Plan:      I have personally reviewed and noted the following in the patient's chart:   . Medical and social history . Use of alcohol, tobacco or illicit drugs  . Current medications and supplements . Functional ability and status . Nutritional status . Physical activity . Advanced directives . List of other physicians . Hospitalizations, surgeries, and ER visits in previous 12 months . Vitals . Screenings to include cognitive, depression, and falls . Referrals and appointments  In addition, I have reviewed and discussed with patient certain preventive protocols, quality metrics, and best practice recommendations. A written personalized care plan for preventive services as well as general preventive health recommendations were provided to patient.     Glyn Ade, NP  07/20/2019

## 2019-07-20 NOTE — Progress Notes (Signed)
.  Chief Complaint  Patient presents with  . Follow-up    x3month   HPI   Patient is doing well.  She is taking her statin and Vitamin D.  No complaints at this time.  She would like some more steroid cream for her hands as she has frequent exacerbations of her eczema. Needs mammogram and she is amenable to get ordered. BP is high.  Would like to change and increase medication.  She is amenable.  Denies chest pain, pressure, or SOB.   Problem List    Problem List: 2020-12: Medicare annual wellness visit, subsequent 2020-12: Hypovitaminosis D 2020-12: Essential hypertension 2020-12: History of colonic polyps 2020-12: Chronic eczema of hand 2020-11: Encounter for screening mammogram for breast cancer 2019-05: Age-related osteoporosis without current pathological  fracture 2019-05: Chronic urticaria 2019-05: Pure hypercholesterolemia   Allergies   has No Known Allergies.  Medications    Current Outpatient Medications:  .  alendronate (FOSAMAX) 70 MG tablet, Take 1 tablet (70 mg total) by mouth every 7 (seven) days. Take with a full glass of water on an empty stomach., Disp: 12 tablet, Rfl: 4 .  hydrOXYzine (ATARAX/VISTARIL) 25 MG tablet, Take 1-2 tablets (25-50 mg total) by mouth every 4 (four) hours as needed for itching (rash)., Disp: 60 tablet, Rfl: 5 .  levocetirizine (XYZAL) 5 MG tablet, Take 1 tablet (5 mg total) by mouth every evening., Disp: 90 tablet, Rfl: 3 .  losartan (COZAAR) 25 MG tablet, Take 1 tablet (25 mg total) by mouth daily., Disp: 30 tablet, Rfl: 1 .  methylPREDNISolone (MEDROL DOSEPAK) 4 MG TBPK tablet, As directed on pkg label, Disp: 21 each, Rfl: 0 .  Multiple Vitamins-Calcium (ONE-A-DAY WOMENS PO), Take by mouth daily., Disp: , Rfl:  .  simvastatin (ZOCOR) 10 MG tablet, Take 1 tablet (10 mg total) by mouth at bedtime., Disp: 30 tablet, Rfl: 1 .  Clobetasol Prop Emollient Base 0.05 % emollient cream, Apply 1 application topically 2 (two) times daily.,  Disp: 60 g, Rfl: 0 .  losartan-hydrochlorothiazide (HYZAAR) 100-25 MG tablet, Take 1 tablet by mouth daily., Disp: 30 tablet, Rfl: 3 .  permethrin (ELIMITE) 5 % cream, APPLY TO ENTIRE BODY LEAVE ON FOR 8 14 HOURS BEFORE WASHING OFF WITH WATER REPEAT THIS REGIMEN IN 1 WEEK IF SYMPTOMS PERSIST, Disp: , Rfl:  .  Vitamin D, Ergocalciferol, (DRISDOL) 1.25 MG (50000 UT) CAPS capsule, Take 1 capsule (50,000 Units total) by mouth every 7 (seven) days for 5 doses., Disp: 5 capsule, Rfl: 3  Current Facility-Administered Medications:  .  0.9 %  sodium chloride infusion, 500 mL, Intravenous, Once, SLadene Artist MD   Review of Systems    Constitutional: Negative for activity change, appetite change, chills and fever.  HENT: Negative for congestion, nosebleeds, trouble swallowing and voice change.   Respiratory: Negative for cough, shortness of breath and wheezing.   Gastrointestinal: Negative for diarrhea, nausea and vomiting.  Genitourinary: Negative for difficulty urinating, dysuria, flank pain and hematuria.  Musculoskeletal: Negative for back pain, joint swelling and neck pain.  Neurological: Negative for dizziness, speech difficulty, light-headedness and numbness.  See HPI. All other review of systems negative.     Physical Exam:   Physical Examination: General appearance - alert, well appearing, and in no distress and oriented to person, place, and time Mental status - normal mood, behavior, speech, dress, motor activity, and thought processes Eyes - not examined, Visually impaired -Peter's anomaly Neck - supple, no significant adenopathy, carotids upstroke  normal bilaterally, no bruits, thyroid exam: thyroid is normal in size without nodules or tenderness Chest - clear to auscultation, no wheezes, rales or rhonchi, symmetric air entry  Heart - normal rate, regular rhythm, normal S1, S2, no murmurs, rubs, clicks or gallops Extremities - dependent LE edema without clubbing or cyanosis Skin -  normal coloration and turgor, no rashes, no suspicious skin lesions noted  No hyperpigmentation of skin.  No current hematomas noted   Lab Review   orders written for new lab studies as appropriate; see orders.   Assessment & Plan:  Shannon [name] is a 68 y.o. female . 1. Essential hypertension   2. Pure hypercholesterolemia   3. Hypovitaminosis D   4. History of colonic polyps   5. Chronic urticaria   6. Chronic eczema of hand    Orders Placed This Encounter  Procedures  . Lipid panel  . Vitamin D 25 (osteoporosis screening)  . CMP14+EGFR   Meds ordered this encounter  Medications  . losartan-hydrochlorothiazide (HYZAAR) 100-25 MG tablet    Sig: Take 1 tablet by mouth daily.    Dispense:  30 tablet    Refill:  3  . Vitamin D, Ergocalciferol, (DRISDOL) 1.25 MG (50000 UT) CAPS capsule    Sig: Take 1 capsule (50,000 Units total) by mouth every 7 (seven) days for 5 doses.    Dispense:  5 capsule    Refill:  3  . Clobetasol Prop Emollient Base 0.05 % emollient cream    Sig: Apply 1 application topically 2 (two) times daily.    Dispense:  60 g    Refill:  0     Glyn Ade, NP

## 2019-07-26 ENCOUNTER — Encounter: Payer: Self-pay | Admitting: Adult Health Nurse Practitioner

## 2019-09-21 ENCOUNTER — Ambulatory Visit (INDEPENDENT_AMBULATORY_CARE_PROVIDER_SITE_OTHER): Payer: Medicare HMO | Admitting: Adult Health Nurse Practitioner

## 2019-09-21 ENCOUNTER — Other Ambulatory Visit: Payer: Self-pay

## 2019-09-21 DIAGNOSIS — E7800 Pure hypercholesterolemia, unspecified: Secondary | ICD-10-CM | POA: Diagnosis not present

## 2019-09-21 DIAGNOSIS — E559 Vitamin D deficiency, unspecified: Secondary | ICD-10-CM | POA: Diagnosis not present

## 2019-09-21 DIAGNOSIS — I10 Essential (primary) hypertension: Secondary | ICD-10-CM

## 2019-09-22 LAB — CMP14+EGFR
ALT: 22 IU/L (ref 0–32)
AST: 26 IU/L (ref 0–40)
Albumin/Globulin Ratio: 1.5 (ref 1.2–2.2)
Albumin: 4.3 g/dL (ref 3.8–4.8)
Alkaline Phosphatase: 87 IU/L (ref 39–117)
BUN/Creatinine Ratio: 20 (ref 12–28)
BUN: 10 mg/dL (ref 8–27)
Bilirubin Total: 0.4 mg/dL (ref 0.0–1.2)
CO2: 23 mmol/L (ref 20–29)
Calcium: 9.4 mg/dL (ref 8.7–10.3)
Chloride: 94 mmol/L — ABNORMAL LOW (ref 96–106)
Creatinine, Ser: 0.49 mg/dL — ABNORMAL LOW (ref 0.57–1.00)
GFR calc Af Amer: 115 mL/min/{1.73_m2} (ref >59)
GFR calc non Af Amer: 100 mL/min/{1.73_m2} (ref >59)
Globulin, Total: 2.8 g/dL (ref 1.5–4.5)
Glucose: 96 mg/dL (ref 65–99)
Potassium: 3.9 mmol/L (ref 3.5–5.2)
Sodium: 134 mmol/L (ref 134–144)
Total Protein: 7.1 g/dL (ref 6.0–8.5)

## 2019-09-22 LAB — LIPID PANEL
Chol/HDL Ratio: 5.9 ratio — ABNORMAL HIGH (ref 0.0–4.4)
Cholesterol, Total: 237 mg/dL — ABNORMAL HIGH (ref 100–199)
HDL: 40 mg/dL (ref >39)
LDL Chol Calc (NIH): 136 mg/dL — ABNORMAL HIGH (ref 0–99)
Triglycerides: 340 mg/dL — ABNORMAL HIGH (ref 0–149)
VLDL Cholesterol Cal: 61 mg/dL — ABNORMAL HIGH (ref 5–40)

## 2019-09-22 LAB — VITAMIN D 25 HYDROXY (VIT D DEFICIENCY, FRACTURES): Vit D, 25-Hydroxy: 33.1 ng/mL (ref 30.0–100.0)

## 2019-09-23 ENCOUNTER — Other Ambulatory Visit: Payer: Self-pay

## 2019-09-23 ENCOUNTER — Ambulatory Visit
Admission: RE | Admit: 2019-09-23 | Discharge: 2019-09-23 | Disposition: A | Discharge status: Home / Self Care | Payer: Medicare HMO | Source: Ambulatory Visit | Attending: Adult Health Nurse Practitioner | Admitting: Adult Health Nurse Practitioner

## 2019-09-23 DIAGNOSIS — Z1231 Encounter for screening mammogram for malignant neoplasm of breast: Secondary | ICD-10-CM

## 2019-09-23 DIAGNOSIS — L508 Other urticaria: Secondary | ICD-10-CM

## 2019-09-23 DIAGNOSIS — Z1321 Encounter for screening for nutritional disorder: Secondary | ICD-10-CM

## 2019-09-23 DIAGNOSIS — Z0000 Encounter for general adult medical examination without abnormal findings: Secondary | ICD-10-CM

## 2019-09-23 DIAGNOSIS — E7800 Pure hypercholesterolemia, unspecified: Secondary | ICD-10-CM

## 2019-09-23 DIAGNOSIS — M810 Age-related osteoporosis without current pathological fracture: Secondary | ICD-10-CM

## 2019-10-03 ENCOUNTER — Ambulatory Visit: Payer: Medicare HMO | Attending: Internal Medicine

## 2019-10-03 DIAGNOSIS — Z23 Encounter for immunization: Secondary | ICD-10-CM | POA: Insufficient documentation

## 2019-10-03 NOTE — Progress Notes (Signed)
   Covid-19 Vaccination Clinic  Name:  Tamanika Higham    MRN: [id number] DOB: [birth year]  10/03/2019  Ms. Vernon was observed post Covid-19 immunization for 15 minutes without incident. She was provided with Vaccine Information Sheet and instruction to access the V-Safe system.   Ms. Downing was instructed to call 911 with any severe reactions post vaccine: Marland Kitchen Difficulty breathing  . Swelling of face and throat  . A fast heartbeat  . A bad rash all over body  . Dizziness and weakness   Immunizations Administered    Name Date Dose VIS Date Route   Pfizer COVID-19 Vaccine 10/03/2019  8:40 AM 0.3 mL 07/08/2019 Intramuscular   Manufacturer: [place]   Lot: [id number]   Rafael Capo: [id number]

## 2019-11-02 ENCOUNTER — Ambulatory Visit: Payer: Medicare HMO | Attending: Internal Medicine

## 2019-11-02 DIAGNOSIS — Z23 Encounter for immunization: Secondary | ICD-10-CM

## 2019-11-02 NOTE — Progress Notes (Signed)
   Covid-19 Vaccination Clinic  Name:  Dionisia Porres    MRN: [id number] DOB: 1951/06/28  11/02/2019  Ms. Nicklaus was observed post Covid-19 immunization for 15 minutes without incident. She was provided with Vaccine Information Sheet and instruction to access the V-Safe system.   Ms. Toma was instructed to call 911 with any severe reactions post vaccine: Marland Kitchen Difficulty breathing  . Swelling of face and throat  . A fast heartbeat  . A bad rash all over body  . Dizziness and weakness   Immunizations Administered    Name Date Dose VIS Date Route   Pfizer COVID-19 Vaccine 11/02/2019  9:46 AM 0.3 mL 07/08/2019 Intramuscular   Manufacturer: [place]   Lot: [id number]   [place]: [place] COVID-19 Vaccine 11/02/2019  9:51 AM 0.3 mL 07/08/2019 Intramuscular   Manufacturer: [employer]   Lot: [id number]   [place]: [id number]

## 2020-02-09 ENCOUNTER — Other Ambulatory Visit: Payer: Self-pay | Admitting: Emergency Medicine

## 2020-02-09 ENCOUNTER — Telehealth: Payer: Self-pay

## 2020-02-09 DIAGNOSIS — I10 Essential (primary) hypertension: Secondary | ICD-10-CM

## 2020-02-09 MED ORDER — LOSARTAN POTASSIUM 25 MG PO TABS: 25.0000 mg | ORAL_TABLET | Freq: Every day | ORAL | 0 refills | Status: DC

## 2020-02-09 NOTE — Telephone Encounter (Signed)
Refill has been sent.  °

## 2020-02-09 NOTE — Telephone Encounter (Signed)
Pt. Scheduled appt with Mr. Orland Mustard for TOC from [facility], requesting refill of losartan until day of visit.

## 2020-04-04 ENCOUNTER — Ambulatory Visit (INDEPENDENT_AMBULATORY_CARE_PROVIDER_SITE_OTHER): Payer: Medicare HMO | Admitting: Registered Nurse

## 2020-04-04 ENCOUNTER — Other Ambulatory Visit: Payer: Self-pay

## 2020-04-04 ENCOUNTER — Encounter: Payer: Self-pay | Admitting: Registered Nurse

## 2020-04-04 VITALS — BP 150/78 | HR 99 | Temp 98.4°F | Ht <= 58 in | Wt 122.0 lb

## 2020-04-04 DIAGNOSIS — Z1329 Encounter for screening for other suspected endocrine disorder: Secondary | ICD-10-CM

## 2020-04-04 DIAGNOSIS — Z23 Encounter for immunization: Secondary | ICD-10-CM | POA: Diagnosis not present

## 2020-04-04 DIAGNOSIS — Z13220 Encounter for screening for lipoid disorders: Secondary | ICD-10-CM

## 2020-04-04 DIAGNOSIS — Z130 Encounter for screening for diseases of the blood and blood-forming organs and certain disorders involving the immune mechanism: Secondary | ICD-10-CM | POA: Diagnosis not present

## 2020-04-04 DIAGNOSIS — M810 Age-related osteoporosis without current pathological fracture: Secondary | ICD-10-CM

## 2020-04-04 DIAGNOSIS — E7800 Pure hypercholesterolemia, unspecified: Secondary | ICD-10-CM | POA: Diagnosis not present

## 2020-04-04 DIAGNOSIS — Z13228 Encounter for screening for other metabolic disorders: Secondary | ICD-10-CM | POA: Diagnosis not present

## 2020-04-04 DIAGNOSIS — I10 Essential (primary) hypertension: Secondary | ICD-10-CM

## 2020-04-04 MED ORDER — BLOOD PRESSURE KIT: 1.0000 | PACK | Freq: Once | 0 refills | Status: AC

## 2020-04-04 MED ORDER — SIMVASTATIN 10 MG PO TABS: 10.0000 mg | ORAL_TABLET | Freq: Every day | ORAL | 3 refills | Status: DC

## 2020-04-04 MED ORDER — ALENDRONATE SODIUM 70 MG PO TABS: 70.0000 mg | ORAL_TABLET | ORAL | 4 refills | Status: DC

## 2020-04-04 MED ORDER — LOSARTAN POTASSIUM-HCTZ 100-25 MG PO TABS: 1.0000 | ORAL_TABLET | Freq: Every day | ORAL | 3 refills | Status: DC

## 2020-04-04 NOTE — Patient Instructions (Signed)
° ° ° °  If you have lab work done today you will be contacted with your lab results within the next 2 weeks.  If you have not heard from us then please contact us. The fastest way to get your results is to register for My Chart. ° ° °IF you received an x-ray today, you will receive an invoice from [facility]. Please contact [facility] at [phone] with questions or concerns regarding your invoice.  ° °IF you received labwork today, you will receive an invoice from [facility]. Please contact [facility] at [phone] with questions or concerns regarding your invoice.  ° °Our billing staff will not be able to assist you with questions regarding bills from these companies. ° °You will be contacted with the lab results as soon as they are available. The fastest way to get your results is to activate your My Chart account. Instructions are located on the last page of this paperwork. If you have not heard from us regarding the results in 2 weeks, please contact this office. °  ° ° ° °

## 2020-04-05 LAB — LIPID PANEL
Chol/HDL Ratio: 6.2 ratio — ABNORMAL HIGH (ref 0.0–4.4)
Cholesterol, Total: 256 mg/dL — ABNORMAL HIGH (ref 100–199)
HDL: 41 mg/dL (ref >39)
LDL Chol Calc (NIH): 154 mg/dL — ABNORMAL HIGH (ref 0–99)
Triglycerides: 328 mg/dL — ABNORMAL HIGH (ref 0–149)
VLDL Cholesterol Cal: 61 mg/dL — ABNORMAL HIGH (ref 5–40)

## 2020-04-05 LAB — CBC WITH DIFFERENTIAL
Basophils Absolute: 0 10*3/uL (ref 0.0–0.2)
Basos: 0 %
EOS (ABSOLUTE): 0 10*3/uL (ref 0.0–0.4)
Eos: 1 %
Hematocrit: 38.8 % (ref 34.0–46.6)
Hemoglobin: 12.5 g/dL (ref 11.1–15.9)
Immature Grans (Abs): 0 10*3/uL (ref 0.0–0.1)
Immature Granulocytes: 0 %
Lymphocytes Absolute: 2.2 10*3/uL (ref 0.7–3.1)
Lymphs: 31 %
MCH: 26.8 pg (ref 26.6–33.0)
MCHC: 32.2 g/dL (ref 31.5–35.7)
MCV: 83 fL (ref 79–97)
Monocytes Absolute: 0.4 10*3/uL (ref 0.1–0.9)
Monocytes: 6 %
Neutrophils Absolute: 4.3 10*3/uL (ref 1.4–7.0)
Neutrophils: 62 %
RBC: 4.66 x10E6/uL (ref 3.77–5.28)
RDW: 13.8 % (ref 11.7–15.4)
WBC: 7 10*3/uL (ref 3.4–10.8)

## 2020-04-05 LAB — COMPREHENSIVE METABOLIC PANEL
ALT: 20 IU/L (ref 0–32)
AST: 24 IU/L (ref 0–40)
Albumin/Globulin Ratio: 1.5 (ref 1.2–2.2)
Albumin: 4.6 g/dL (ref 3.8–4.8)
Alkaline Phosphatase: 82 IU/L (ref 48–121)
BUN/Creatinine Ratio: 18 (ref 12–28)
BUN: 12 mg/dL (ref 8–27)
Bilirubin Total: 0.3 mg/dL (ref 0.0–1.2)
CO2: 26 mmol/L (ref 20–29)
Calcium: 9.4 mg/dL (ref 8.7–10.3)
Chloride: 101 mmol/L (ref 96–106)
Creatinine, Ser: 0.65 mg/dL (ref 0.57–1.00)
GFR calc Af Amer: 104 mL/min/{1.73_m2} (ref >59)
GFR calc non Af Amer: 90 mL/min/{1.73_m2} (ref >59)
Globulin, Total: 3 g/dL (ref 1.5–4.5)
Glucose: 105 mg/dL — ABNORMAL HIGH (ref 65–99)
Potassium: 4 mmol/L (ref 3.5–5.2)
Sodium: 141 mmol/L (ref 134–144)
Total Protein: 7.6 g/dL (ref 6.0–8.5)

## 2020-04-05 LAB — HEMOGLOBIN A1C
Est. average glucose Bld gHb Est-mCnc: 114 mg/dL
Hgb A1c MFr Bld: 5.6 % (ref 4.8–5.6)

## 2020-04-05 LAB — TSH: TSH: 1.61 u[IU]/mL (ref 0.450–4.500)

## 2020-04-06 ENCOUNTER — Other Ambulatory Visit: Payer: Self-pay | Admitting: Registered Nurse

## 2020-04-06 DIAGNOSIS — E7800 Pure hypercholesterolemia, unspecified: Secondary | ICD-10-CM

## 2020-04-06 MED ORDER — ATORVASTATIN CALCIUM 20 MG PO TABS: 20.0000 mg | ORAL_TABLET | Freq: Every day | ORAL | 3 refills | Status: DC

## 2020-04-06 NOTE — Progress Notes (Signed)
Good morning!  If we could call Shannon [name], I want to increase her cholesterol medication. She is currently on simvastatin 10mg  PO qd with dinner. I want to start her on atorvastatin 20mg  PO qd with dinner. I would like to see her in 6 months to recheck these levels.  Otherwise, her labs are looking stellar.   Thank you  Kathrin Ruddy, NP

## 2020-06-18 ENCOUNTER — Encounter: Payer: Self-pay | Admitting: Registered Nurse

## 2020-06-18 NOTE — Progress Notes (Signed)
Established Patient Office Visit  Subjective:  Patient ID: Shannon [name], female    DOB: 1951-02-21  Age: 69 y.o. MRN: [id number]  CC:  Chief Complaint  Patient presents with  . Transitions Of Care  . Hypertension    HPI Shannon [name] presents for transfer of care.  Has been est with Jens Som  Hypertension: Patient Currently taking: losartan-hctz 100-25m PO qd Good effect. No AEs. Denies CV symptoms including: chest pain, shob, doe, headache, visual changes, fatigue, claudication, and dependent edema.   Previous readings and labs: BP Readings from Last 3 Encounters:  04/04/20 (!) 150/78  06/22/19 (!) 169/89  06/15/19 (!) 144/96   Lab Results  Component Value Date   CREATININE 0.65 04/04/2020    HLD: taking simvastatin 172mPO qd with dinner. Good effect. No AEs. Lifestyle modifications are average.   Osteoporosis: though to be age related / estrogen deficiency. No current or hx of pathological fracture. Takes fosamax 7084mO weekly. No complaints.  Feeling well overall. Has been some months since labs were drawn. Reviewed last labs with patient.   Past Medical History:  Diagnosis Date  . Chronic eczema of hand 07/20/2019  . Essential hypertension 07/20/2019  . Heat rash    with itching  . History of colonic polyps 07/20/2019  . Hypovitaminosis D 07/20/2019    Past Surgical History:  Procedure Laterality Date  . NO PAST SURGERIES      Family History  Problem Relation Age of Onset  . Other Mother     Social History   Socioeconomic History  . Marital status: Married    Spouse name: Not on file  . Number of children: 4  . Years of education: Not on file  . Highest education level: Not on file  Occupational History  . Not on file  Tobacco Use  . Smoking status: Never Smoker  . Smokeless tobacco: Never Used  Vaping Use  . Vaping Use: Never used  Substance and Sexual Activity  . Alcohol use: No  . Drug use: No  . Sexual activity:  Yes  Other Topics Concern  . Not on file  Social History Narrative  . Not on file   Social Determinants of Health   Financial Resource Strain:   . Difficulty of Paying Living Expenses: Not on file  Food Insecurity:   . Worried About RunChar[employer] fundraiser the Last Year: Not on file  . Ran Out of Food in the Last Year: Not on file  Transportation Needs:   . Lack of Transportation (Medical): Not on file  . Lack of Transportation (Non-Medical): Not on file  Physical Activity:   . Days of Exercise per Week: Not on file  . Minutes of Exercise per Session: Not on file  Stress:   . Feeling of Stress : Not on file  Social Connections:   . Frequency of Communication with Friends and Family: Not on file  . Frequency of Social Gatherings with Friends and Family: Not on file  . Attends Religious Services: Not on file  . Active Member of Clubs or Organizations: Not on file  . Attends CluArchivistetings: Not on file  . Marital Status: Not on file  Intimate Partner Violence:   . Fear of Current or Ex-Partner: Not on file  . Emotionally Abused: Not on file  . Physically Abused: Not on file  . Sexually Abused: Not on file    Outpatient Medications Prior to Visit  Medication Sig Dispense  Refill  . Multiple Vitamins-Calcium (ONE-A-DAY WOMENS PO) Take by mouth daily.    Marland Kitchen alendronate (FOSAMAX) 70 MG tablet Take 1 tablet (70 mg total) by mouth every 7 (seven) days. Take with a full glass of water on an empty stomach. 12 tablet 4  . hydrOXYzine (ATARAX/VISTARIL) 25 MG tablet Take 1-2 tablets (25-50 mg total) by mouth every 4 (four) hours as needed for itching (rash). 60 tablet 5  . levocetirizine (XYZAL) 5 MG tablet Take 1 tablet (5 mg total) by mouth every evening. 90 tablet 3  . losartan (COZAAR) 25 MG tablet Take 1 tablet (25 mg total) by mouth daily. 30 tablet 0  . losartan-hydrochlorothiazide (HYZAAR) 100-25 MG tablet Take 1 tablet by mouth daily. 30 tablet 3  .  methylPREDNISolone (MEDROL DOSEPAK) 4 MG TBPK tablet As directed on pkg label 21 each 0  . permethrin (ELIMITE) 5 % cream APPLY TO ENTIRE BODY LEAVE ON FOR 8 14 HOURS BEFORE WASHING OFF WITH WATER REPEAT THIS REGIMEN IN 1 WEEK IF SYMPTOMS PERSIST    . simvastatin (ZOCOR) 10 MG tablet Take 1 tablet (10 mg total) by mouth at bedtime. 30 tablet 1  . 0.9 %  sodium chloride infusion      No facility-administered medications prior to visit.    No Known Allergies  ROS Review of Systems  Constitutional: Negative.   HENT: Negative.   Eyes: Negative.   Respiratory: Negative.   Cardiovascular: Negative.   Gastrointestinal: Negative.   Genitourinary: Negative.   Musculoskeletal: Negative.   Skin: Negative.   Neurological: Negative.   Psychiatric/Behavioral: Negative.       Objective:    Physical Exam Vitals and nursing note reviewed.  Constitutional:      General: She is not in acute distress.    Appearance: Normal appearance. She is normal weight. She is not ill-appearing, toxic-appearing or diaphoretic.  Cardiovascular:     Rate and Rhythm: Normal rate and regular rhythm.     Heart sounds: Normal heart sounds. No murmur heard.  No friction rub. No gallop.   Pulmonary:     Effort: Pulmonary effort is normal. No respiratory distress.     Breath sounds: Normal breath sounds. No stridor. No wheezing, rhonchi or rales.  Chest:     Chest wall: No tenderness.  Skin:    General: Skin is warm and dry.  Neurological:     General: No focal deficit present.     Mental Status: She is alert and oriented to person, place, and time. Mental status is at baseline.  Psychiatric:        Mood and Affect: Mood normal.        Behavior: Behavior normal.        Thought Content: Thought content normal.        Judgment: Judgment normal.     BP (!) 150/78   Pulse 99   Temp 98.4 F (36.9 C) (Temporal)   Ht '4\' 6"'  (1.372 m)   Wt 122 lb (55.3 kg)   BMI 29.42 kg/m  Wt Readings from Last 3  Encounters:  04/04/20 122 lb (55.3 kg)  07/20/19 121 lb 6.4 oz (55.1 kg)  06/22/19 120 lb (54.4 kg)     There are no preventive care reminders to display for this patient.  There are no preventive care reminders to display for this patient.  Lab Results  Component Value Date   TSH 1.610 04/04/2020   Lab Results  Component Value Date   WBC 7.0  04/04/2020   HGB 12.5 04/04/2020   HCT 38.8 04/04/2020   MCV 83 04/04/2020   PLT 276 12/03/2017   Lab Results  Component Value Date   NA 141 04/04/2020   K 4.0 04/04/2020   CO2 26 04/04/2020   GLUCOSE 105 (H) 04/04/2020   BUN 12 04/04/2020   CREATININE 0.65 04/04/2020   BILITOT 0.3 04/04/2020   ALKPHOS 82 04/04/2020   AST 24 04/04/2020   ALT 20 04/04/2020   PROT 7.6 04/04/2020   ALBUMIN 4.6 04/04/2020   CALCIUM 9.4 04/04/2020   Lab Results  Component Value Date   CHOL 256 (H) 04/04/2020   Lab Results  Component Value Date   HDL 41 04/04/2020   Lab Results  Component Value Date   LDLCALC 154 (H) 04/04/2020   Lab Results  Component Value Date   TRIG 328 (H) 04/04/2020   Lab Results  Component Value Date   CHOLHDL 6.2 (H) 04/04/2020   Lab Results  Component Value Date   HGBA1C 5.6 04/04/2020      Assessment & Plan:   Problem List Items Addressed This Visit      Cardiovascular and Mediastinum   Essential hypertension (Chronic)   Relevant Medications   losartan-hydrochlorothiazide (HYZAAR) 100-25 MG tablet   Other Relevant Orders   CBC With Differential (Completed)   Comprehensive metabolic panel (Completed)   Lipid panel (Completed)   TSH (Completed)   Hemoglobin A1c (Completed)     Musculoskeletal and Integument   Age-related osteoporosis without current pathological fracture (Chronic)   Relevant Medications   alendronate (FOSAMAX) 70 MG tablet     Other   Pure hypercholesterolemia (Chronic)   Relevant Medications   losartan-hydrochlorothiazide (HYZAAR) 100-25 MG tablet    Other Visit  Diagnoses    Screening for endocrine, metabolic and immunity disorder    -  Primary   Relevant Orders   CBC With Differential (Completed)   Comprehensive metabolic panel (Completed)   Lipid panel (Completed)   TSH (Completed)   Hemoglobin A1c (Completed)   Lipid screening       Relevant Orders   CBC With Differential (Completed)   Comprehensive metabolic panel (Completed)   Lipid panel (Completed)   TSH (Completed)   Hemoglobin A1c (Completed)   Flu vaccine need       Relevant Orders   Flu Vaccine QUAD High Dose(Fluad) (Completed)      Meds ordered this encounter  Medications  . alendronate (FOSAMAX) 70 MG tablet    Sig: Take 1 tablet (70 mg total) by mouth every 7 (seven) days. Take with a full glass of water on an empty stomach.    Dispense:  12 tablet    Refill:  4    Order Specific Question:   Supervising Provider    Answer:   Carlota Raspberry, JEFFREY R [[id number]]  . losartan-hydrochlorothiazide (HYZAAR) 100-25 MG tablet    Sig: Take 1 tablet by mouth daily.    Dispense:  90 tablet    Refill:  3    Order Specific Question:   Supervising Provider    Answer:   Carlota Raspberry, JEFFREY R [[id number]]  . DISCONTD: simvastatin (ZOCOR) 10 MG tablet    Sig: Take 1 tablet (10 mg total) by mouth at bedtime.    Dispense:  90 tablet    Refill:  3    Order Specific Question:   Supervising Provider    Answer:   Carlota Raspberry, JEFFREY R [[id number]]  . Blood Pressure KIT    Sig:  1 each by Does not apply route once for 1 dose.    Dispense:  1 kit    Refill:  0    Order Specific Question:   Supervising Provider    Answer:   Carlota Raspberry, JEFFREY R [[id number]]    Follow-up: No follow-ups on file.   PLAN  Refill meds  Send order for new bp kit  Labs collected, will follow up as warranted  Return in 6 mo for med check  Patient encouraged to call clinic with any questions, comments, or concerns.  Maximiano Coss, NP

## 2020-10-01 ENCOUNTER — Other Ambulatory Visit: Payer: Self-pay | Admitting: Registered Nurse

## 2020-10-01 DIAGNOSIS — Z1231 Encounter for screening mammogram for malignant neoplasm of breast: Secondary | ICD-10-CM

## 2020-10-24 ENCOUNTER — Encounter: Payer: Self-pay | Admitting: Internal Medicine

## 2020-10-24 ENCOUNTER — Encounter: Payer: Self-pay | Admitting: Registered Nurse

## 2020-11-07 ENCOUNTER — Ambulatory Visit: Payer: Medicare HMO | Admitting: Internal Medicine

## 2020-11-21 ENCOUNTER — Ambulatory Visit
Admission: RE | Admit: 2020-11-21 | Discharge: 2020-11-21 | Disposition: A | Discharge status: Home / Self Care | Payer: Medicare HMO | Source: Ambulatory Visit | Attending: Registered Nurse | Admitting: Registered Nurse

## 2020-11-21 ENCOUNTER — Other Ambulatory Visit: Payer: Self-pay

## 2020-11-21 DIAGNOSIS — Z1231 Encounter for screening mammogram for malignant neoplasm of breast: Secondary | ICD-10-CM

## 2020-11-21 NOTE — Progress Notes (Signed)
FYI - pt has established with you

## 2020-11-28 ENCOUNTER — Other Ambulatory Visit: Payer: Self-pay

## 2020-11-28 ENCOUNTER — Telehealth (INDEPENDENT_AMBULATORY_CARE_PROVIDER_SITE_OTHER): Payer: Medicare HMO | Admitting: Internal Medicine

## 2020-11-28 DIAGNOSIS — Z7689 Persons encountering health services in other specified circumstances: Secondary | ICD-10-CM

## 2020-11-28 DIAGNOSIS — E7800 Pure hypercholesterolemia, unspecified: Secondary | ICD-10-CM | POA: Diagnosis not present

## 2020-11-28 DIAGNOSIS — I10 Essential (primary) hypertension: Secondary | ICD-10-CM

## 2020-11-28 MED ORDER — LOSARTAN POTASSIUM-HCTZ 100-25 MG PO TABS: 1.0000 | ORAL_TABLET | Freq: Every day | ORAL | 3 refills | Status: DC

## 2020-11-28 MED ORDER — ATORVASTATIN CALCIUM 20 MG PO TABS: 20.0000 mg | ORAL_TABLET | Freq: Every day | ORAL | 3 refills | Status: DC

## 2020-11-28 NOTE — Progress Notes (Signed)
Virtual Visit via Telephone Note  I connected with Shannon [name], on 11/28/2020 at 1:41 PM by telephone due to the COVID-19 pandemic and verified that I am speaking with the correct person using two identifiers.   Consent: I discussed the limitations, risks, security and privacy concerns of performing an evaluation and management service by telephone and the availability of in person appointments. I also discussed with the patient that there may be a patient responsible charge related to this service. The patient expressed understanding and agreed to proceed.   Location of Patient: Home   Location of Provider: Clinic    Persons participating in Telemedicine visit: Deidre Mathwig Jay'a Pollock-CMA  Dr. Juleen China  Kim-interpreter    History of Present Illness: Patient has a visit to establish care. Was previously established with [facility] for 3 years but they have closed. PMH of HTN, HLD, Osteoporosis. BP today is 126/85. Has no concerns today. Just wanted to have her medications refilled and to have a new physician she was established with that.    Past Medical History:  Diagnosis Date  . Chronic eczema of hand 07/20/2019  . Essential hypertension 07/20/2019  . Heat rash    with itching  . History of colonic polyps 07/20/2019  . Hypovitaminosis D 07/20/2019   No Known Allergies  Current Outpatient Medications on File Prior to Visit  Medication Sig Dispense Refill  . atorvastatin (LIPITOR) 20 MG tablet Take 1 tablet (20 mg total) by mouth daily. 90 tablet 3  . losartan-hydrochlorothiazide (HYZAAR) 100-25 MG tablet Take 1 tablet by mouth daily. 90 tablet 3  . alendronate (FOSAMAX) 70 MG tablet Take 1 tablet (70 mg total) by mouth every 7 (seven) days. Take with a full glass of water on an empty stomach. (Patient not taking: Reported on 11/28/2020) 12 tablet 4  . Multiple Vitamins-Calcium (ONE-A-DAY WOMENS PO) Take by mouth daily. (Patient not taking: Reported on 11/28/2020)      No current facility-administered medications on file prior to visit.    Observations/Objective: NAD. Speaking clearly.  Work of breathing normal.  Alert and oriented. Mood appropriate.   Assessment and Plan: 1. Encounter to establish care Reviewed patient's PMH, social history, surgical history, and medications.  She is up to date with her HM topics.   2. Pure hypercholesterolemia - atorvastatin (LIPITOR) 20 MG tablet; Take 1 tablet (20 mg total) by mouth daily.  Dispense: 90 tablet; Refill: 3  3. Essential hypertension BP well controlled. Asymptomatic. Continue current regimen.  - losartan-hydrochlorothiazide (HYZAAR) 100-25 MG tablet; Take 1 tablet by mouth daily.  Dispense: 90 tablet; Refill: 3    Follow Up Instructions: 6 month f/u for annual exam    I discussed the assessment and treatment plan with the patient. The patient was provided an opportunity to ask questions and all were answered. The patient agreed with the plan and demonstrated an understanding of the instructions.   The patient was advised to call back or seek an in-person evaluation if the symptoms worsen or if the condition fails to improve as anticipated.     I provided 9 minutes total of non-face-to-face time during this encounter including median intraservice time, reviewing previous notes, investigations, ordering medications, medical decision making, coordinating care and patient verbalized understanding at the end of the visit.    Phill Myron, D.O. Primary Care at [facility]  11/28/2020, 1:41 PM

## 2021-02-27 ENCOUNTER — Other Ambulatory Visit: Payer: Self-pay

## 2021-02-27 ENCOUNTER — Ambulatory Visit (INDEPENDENT_AMBULATORY_CARE_PROVIDER_SITE_OTHER): Payer: Medicare HMO | Admitting: Family

## 2021-02-27 ENCOUNTER — Encounter: Payer: Self-pay | Admitting: Family

## 2021-02-27 VITALS — BP 159/90 | HR 89 | Temp 98.3°F | Resp 15 | Ht <= 58 in | Wt 119.4 lb

## 2021-02-27 DIAGNOSIS — Z130 Encounter for screening for diseases of the blood and blood-forming organs and certain disorders involving the immune mechanism: Secondary | ICD-10-CM | POA: Diagnosis not present

## 2021-02-27 DIAGNOSIS — Z0001 Encounter for general adult medical examination with abnormal findings: Secondary | ICD-10-CM | POA: Diagnosis not present

## 2021-02-27 DIAGNOSIS — I10 Essential (primary) hypertension: Secondary | ICD-10-CM

## 2021-02-27 DIAGNOSIS — Z789 Other specified health status: Secondary | ICD-10-CM

## 2021-02-27 DIAGNOSIS — Z0000 Encounter for general adult medical examination without abnormal findings: Secondary | ICD-10-CM

## 2021-02-27 DIAGNOSIS — Z131 Encounter for screening for diabetes mellitus: Secondary | ICD-10-CM | POA: Diagnosis not present

## 2021-02-27 DIAGNOSIS — Z1211 Encounter for screening for malignant neoplasm of colon: Secondary | ICD-10-CM

## 2021-02-27 DIAGNOSIS — Z13228 Encounter for screening for other metabolic disorders: Secondary | ICD-10-CM | POA: Diagnosis not present

## 2021-02-27 DIAGNOSIS — Z1329 Encounter for screening for other suspected endocrine disorder: Secondary | ICD-10-CM | POA: Diagnosis not present

## 2021-02-27 DIAGNOSIS — Z5320 Procedure and treatment not carried out because of patient's decision for unspecified reasons: Secondary | ICD-10-CM | POA: Diagnosis not present

## 2021-02-27 DIAGNOSIS — E785 Hyperlipidemia, unspecified: Secondary | ICD-10-CM | POA: Diagnosis not present

## 2021-02-27 DIAGNOSIS — Z0100 Encounter for examination of eyes and vision without abnormal findings: Secondary | ICD-10-CM

## 2021-02-27 MED ORDER — LOSARTAN POTASSIUM-HCTZ 100-25 MG PO TABS: 1.0000 | ORAL_TABLET | Freq: Every day | ORAL | 0 refills | Status: DC

## 2021-02-27 MED ORDER — ATORVASTATIN CALCIUM 20 MG PO TABS: 20.0000 mg | ORAL_TABLET | Freq: Every day | ORAL | 0 refills | Status: DC

## 2021-02-27 NOTE — Progress Notes (Signed)
Pt presents for annual physical exam needs refill on atorvastatin and losartan-hctz

## 2021-02-27 NOTE — Progress Notes (Signed)
Subjective:   Shannon [name] is a 70 y.o. female who presents for Medicare Annual (Subsequent) preventive examination.  Review of Systems: Feeling well today. Reports she did not take blood pressure medications this morning because she thought this visit was supposed to be fasting. Does not check blood pressure at home. Requesting refills on Losartan-Hydrochlorothiazide and Atorvastatin.   Cardiac Risk Factors include: advanced age (>84mn, >>15women);hypertension   Objective:    Today's Vitals   02/27/21 1030  BP: (!) 159/90  Pulse: 89  Resp: 15  Temp: 98.3 F (36.8 C)  SpO2: 98%  Weight: 119 lb 6.4 oz (54.2 kg)  Height: 4' 6.02" (1.372 m)  PainSc: 0-No pain   Body mass index is 28.77 kg/m.  No flowsheet data found.  Current Medications (verified) Outpatient Encounter Medications as of 02/27/2021  Medication Sig   atorvastatin (LIPITOR) 20 MG tablet Take 1 tablet (20 mg total) by mouth daily.   losartan-hydrochlorothiazide (HYZAAR) 100-25 MG tablet Take 1 tablet by mouth daily.   No facility-administered encounter medications on file as of 02/27/2021.    Allergies (verified) Patient has no known allergies.   History: Past Medical History:  Diagnosis Date   Chronic eczema of hand 07/20/2019   Essential hypertension 07/20/2019   Heat rash    with itching   History of colonic polyps 07/20/2019   Hypovitaminosis D 07/20/2019   Past Surgical History:  Procedure Laterality Date   NO PAST SURGERIES     Family History  Problem Relation Age of Onset   Other Mother    Social History   Socioeconomic History   Marital status: Married    Spouse name: Not on file   Number of children: 4   Years of education: Not on file   Highest education level: Not on file  Occupational History   Not on file  Tobacco Use   Smoking status: Never   Smokeless tobacco: Never  Vaping Use   Vaping Use: Never used  Substance and Sexual Activity   Alcohol use: No   Drug  use: No   Sexual activity: Yes  Other Topics Concern   Not on file  Social History Narrative   Not on file   Social Determinants of Health   Financial Resource Strain: Not on file  Food Insecurity: Not on file  Transportation Needs: Not on file  Physical Activity: Not on file  Stress: Not on file  Social Connections: Not on file    Tobacco Counseling Reports does not smoke currently or in the past.   Clinical Intake:  Pre-visit preparation completed: Yes  Pain : 0-10 Pain Score: 0-No pain   Diabetes: No  Diabetic? No  Interpreter Needed?: Yes Interpreter Agency[place] [facility]: [facility]Interpreter ID: [email or web address] Patient Declined Interpreter : No Patient signed CSlaughterswaiver: No  Activities of Daily Living In your present state of health, do you have any difficulty performing the following activities: 02/27/2021  Hearing? N  Vision? N  Difficulty concentrating or making decisions? N  Walking or climbing stairs? N  Dressing or bathing? N  Doing errands, shopping? N  Preparing Food and eating ? N  Using the Toilet? N  In the past six months, have you accidently leaked urine? N  Do you have problems with loss of bowel control? N  Managing your Medications? N  Managing your Finances? N  Housekeeping or managing your Housekeeping? N  Some recent data might be hidden    Patient Care  Team: Nicolette Bang, MD as PCP - General (Family Medicine)  Indicate any recent Medical Services you may have received from other than [facility] providers in the past year (date may be approximate).  Assessment:   This is a routine wellness examination for Shannon [name].  Hearing/Vision screen No results found.  Dietary issues and exercise activities discussed: Current Exercise Habits: The patient does not participate in regular exercise at present, Exercise limited by: None identified  Goals:  "Would like to get more healthy."  Depression Screen PHQ 2/9  Scores 02/27/2021 11/28/2020 04/04/2020 07/20/2019 06/22/2019 06/15/2019 12/03/2017  PHQ - 2 Score 0 0 0 0 0 0 0    Fall Risk Fall Risk  02/27/2021 11/28/2020 04/04/2020 07/20/2019 06/22/2019  Falls in the past year? 0 0 0 0 0  Number falls in past yr: 0 0 0 0 0  Injury with Fall? 0 0 0 0 0  Risk for fall due to : No Fall Risks No Fall Risks - - -  Follow up Falls evaluation completed - Falls evaluation completed Falls evaluation completed -    FALL RISK PREVENTION PERTAINING TO THE HOME: Any stairs in or around the home? Yes  If so, are there any without handrails? No  Home free of loose throw rugs in walkways, pet beds, electrical cords, etc? No Adequate lighting in your home to reduce risk of falls? Yes   ASSISTIVE DEVICES UTILIZED TO PREVENT FALLS: Life alert? No  Use of a cane, walker or w/c? No  Grab bars in the bathroom? Yes  Shower chair or bench in shower? No  Elevated toilet seat or a handicapped toilet? No   TIMED UP AND GO: Was the test performed? Yes .  Length of time to ambulate 10 feet: 10 sec.   Gait steady and fast without use of assistive device  Cognitive Function: MMSE - Mini Mental State Exam 02/27/2021  Orientation to time 5  Orientation to Place 5  Registration 3  Attention/ Calculation 5  Recall 3  Language- name 2 objects 2  Language- repeat 1  Language- follow 3 step command 3  Language- read & follow direction 1  Write a sentence 1  Copy design 1  Total score 30    6CIT Screen 06/15/2019  What Year? 0 points  What month? 0 points  What time? 0 points  Count back from 20 2 points  Months in reverse 2 points  Repeat phrase 10 points  Total Score 14    Immunizations Immunization History  Administered Date(s) Administered   Fluad Quad(high Dose 65+) 04/04/2020   Hepatitis A 07/15/2004   Influenza,inj,Quad PF,6+ Mos 05/12/2014, 04/18/2016   PFIZER(Purple Top)SARS-COV-2 Vaccination 10/03/2019, 11/02/2019, 11/02/2019   Pneumococcal Conjugate-13  11/06/2015   Pneumococcal Polysaccharide-23 11/27/2016   Tdap 05/12/2014   Zoster, Live 05/28/2016    TDAP status: Up to date  Pneumococcal vaccine status: Up to date  Covid-19 vaccine status: Declined, Education has been provided regarding the importance of this vaccine but patient still declined. Advised may receive this vaccine at local pharmacy or Health Dept.or vaccine clinic. Aware to provide a copy of the vaccination record if obtained from local pharmacy or Health Dept. Verbalized acceptance and understanding.  Qualifies for Shingles Vaccine?  Reports already had at [facility]. Plans to get records from there and give provider copy.    Screening Tests Health Maintenance  Topic Date Due   Zoster Vaccines- Shingrix (1 of 2) Never done   COVID-19 Vaccine (3 - Booster  for [place] series) 04/03/2020   COLONOSCOPY (Pts 45-32yr Insurance coverage will need to be confirmed)  02/02/2021   INFLUENZA VACCINE  02/25/2021   MAMMOGRAM  11/22/2022   TETANUS/TDAP  05/12/2024   DEXA SCAN  Completed   Hepatitis C Screening  Completed   PNA vac Low Risk Adult  Completed   HPV VACCINES  Aged Out    Health Maintenance  Health Maintenance Due  Topic Date Due   Zoster Vaccines- Shingrix (1 of 2) Never done   COVID-19 Vaccine (3 - Booster for Pfizer series) 04/03/2020   COLONOSCOPY (Pts 45-470yrInsurance coverage will need to be confirmed)  02/02/2021   INFLUENZA VACCINE  02/25/2021   Colorectal cancer screening: patient declined   Mammogram status: Completed 11/21/2020. Repeat every year  Bone Density status: Completed 01/20/2017. Results reflect: Bone density results: OSTEOPOROSIS. Repeat every as needed years.  Lung Cancer Screening: (Low Dose CT Chest recommended if Age 71-80ears, 30 pack-year currently smoking OR have quit w/in 15years.) does not qualify.   Lung Cancer Screening Referral: not applicable  Additional Screening:  Hepatitis C Screening: does not qualify; Completed  11/27/2016  Vision Screening: Recommended annual ophthalmology exams for early detection of glaucoma and other disorders of the eye. Is the patient up to date with their annual eye exam?  No  Who is the provider or what is the name of the office in which the patient attends annual eye exams? Has not seen eye doctor. If pt is not established with a provider, would they like to be referred to a provider to establish care? Yes .   Dental Screening: Recommended annual dental exams for proper oral hygiene  Community Resource Referral / Chronic Care Management: CRR required this visit?  No   CCM required this visit?  No    Plan:  1. Medicare annual wellness visit, subsequent: - Counseled on 150 minutes of exercise per week as tolerated, healthy eating (including decreased daily intake of saturated fats, cholesterol, added sugars, sodium), STI prevention, and routine healthcare maintenance.  2. Screening for metabolic disorder: - [id number] check kidney function, liver function, and electrolyte balance.  - CMP14+EGFR  3. Screening for deficiency anemia: - CBC to screen for anemia. - CBC  4. Diabetes mellitus screening: - Hemoglobin A1c to screen for pre-diabetes/diabetes. - Hemoglobin A1c  5. Thyroid disorder screen: - TSH to check thyroid function.  - TSH  6. Colon cancer screening declined: - Patient declined.  7. Routine eye exam: - Referral to Ophthalmology for routine eye exam.  - Ambulatory referral to Ophthalmology  8. Essential hypertension: - Blood pressure not at goal during today's visit. Patient asymptomatic without chest pressure, chest pain, palpitations, shortness of breath, and worst headache of life. - Patient endorses she did not take blood pressure medication this morning because she thought this visit was fasting. - Continue Losartan-Hydrochlorothiazide as prescribed and to take dose once she returns home.  - Counseled on blood pressure goal of less than  140/90, low-sodium, DASH diet, medication compliance, 150 minutes of moderate intensity exercise per week as tolerated. Discussed medication compliance, adverse effects. - Follow-up with primary provider in 2 weeks or sooner if needed.  - losartan-hydrochlorothiazide (HYZAAR) 100-25 MG tablet; Take 1 tablet by mouth daily.  Dispense: 90 tablet; Refill: 0  9. Hyperlipidemia, unspecified hyperlipidemia type: -Practice low-fat heart healthy diet and at least 150 minutes of moderate intensity exercise weekly as tolerated.  - Continue Atorvastatin as prescribed. - Lipid panel to screen for  level of cholesterol control.  - Follow-up with primary provider as scheduled.  - Lipid panel - atorvastatin (LIPITOR) 20 MG tablet; Take 1 tablet (20 mg total) by mouth daily.  Dispense: 90 tablet; Refill: 0  10. Language barrier: [facility] in-person interpreter participated during today's visit. Interpreter Name: Simmaly.   I have personally reviewed and noted the following in the patient's chart:   Medical and social history Use of alcohol, tobacco or illicit drugs  Current medications and supplements including opioid prescriptions.  Functional ability and status Nutritional status Physical activity Advanced directives List of other physicians Hospitalizations, surgeries, and ER visits in previous 12 months Vitals Screenings to include cognitive, depression, and falls Referrals and appointments  In addition, I have reviewed and discussed with patient certain preventive protocols, quality metrics, and best practice recommendations. A written personalized care plan for preventive services as well as general preventive health recommendations were provided to patient.    Camillia Herter, NP   02/27/2021

## 2021-02-28 LAB — LIPID PANEL
Chol/HDL Ratio: 2.2 ratio (ref 0.0–4.4)
Cholesterol, Total: 119 mg/dL (ref 100–199)
HDL: 55 mg/dL (ref >39)
LDL Chol Calc (NIH): 44 mg/dL (ref 0–99)
Triglycerides: 111 mg/dL (ref 0–149)
VLDL Cholesterol Cal: 20 mg/dL (ref 5–40)

## 2021-02-28 LAB — CMP14+EGFR
ALT: 101 IU/L — ABNORMAL HIGH (ref 0–32)
AST: 64 IU/L — ABNORMAL HIGH (ref 0–40)
Albumin/Globulin Ratio: 1.4 (ref 1.2–2.2)
Albumin: 4.2 g/dL (ref 3.7–4.7)
Alkaline Phosphatase: 227 IU/L — ABNORMAL HIGH (ref 44–121)
BUN/Creatinine Ratio: 16 (ref 12–28)
BUN: 9 mg/dL (ref 8–27)
Bilirubin Total: 0.8 mg/dL (ref 0.0–1.2)
CO2: 24 mmol/L (ref 20–29)
Calcium: 9.2 mg/dL (ref 8.7–10.3)
Chloride: 94 mmol/L — ABNORMAL LOW (ref 96–106)
Creatinine, Ser: 0.58 mg/dL (ref 0.57–1.00)
Globulin, Total: 3 g/dL (ref 1.5–4.5)
Glucose: 96 mg/dL (ref 65–99)
Potassium: 3.6 mmol/L (ref 3.5–5.2)
Sodium: 132 mmol/L — ABNORMAL LOW (ref 134–144)
Total Protein: 7.2 g/dL (ref 6.0–8.5)
eGFR: 97 mL/min/{1.73_m2} (ref >59)

## 2021-02-28 LAB — HEMOGLOBIN A1C
Est. average glucose Bld gHb Est-mCnc: 117 mg/dL
Hgb A1c MFr Bld: 5.7 % — ABNORMAL HIGH (ref 4.8–5.6)

## 2021-02-28 LAB — CBC
Hematocrit: 36.1 % (ref 34.0–46.6)
Hemoglobin: 11.9 g/dL (ref 11.1–15.9)
MCH: 27.7 pg (ref 26.6–33.0)
MCHC: 33 g/dL (ref 31.5–35.7)
MCV: 84 fL (ref 79–97)
Platelets: 259 10*3/uL (ref 150–450)
RBC: 4.3 x10E6/uL (ref 3.77–5.28)
RDW: 13 % (ref 11.7–15.4)
WBC: 8.1 10*3/uL (ref 3.4–10.8)

## 2021-02-28 LAB — TSH: TSH: 1.51 u[IU]/mL (ref 0.450–4.500)

## 2021-03-19 NOTE — Progress Notes (Signed)
Patient did not show for appointment.   

## 2021-03-20 ENCOUNTER — Encounter: Payer: Medicare HMO | Admitting: Family

## 2021-03-20 DIAGNOSIS — Z789 Other specified health status: Secondary | ICD-10-CM

## 2021-03-20 DIAGNOSIS — I10 Essential (primary) hypertension: Secondary | ICD-10-CM

## 2021-04-01 ENCOUNTER — Other Ambulatory Visit: Payer: Self-pay | Admitting: Registered Nurse

## 2021-04-01 DIAGNOSIS — I10 Essential (primary) hypertension: Secondary | ICD-10-CM

## 2021-04-08 ENCOUNTER — Encounter: Payer: Medicare HMO | Admitting: Family

## 2021-04-28 ENCOUNTER — Encounter: Payer: Self-pay | Admitting: Gastroenterology

## 2021-05-06 ENCOUNTER — Other Ambulatory Visit: Payer: Self-pay

## 2021-05-06 ENCOUNTER — Ambulatory Visit (INDEPENDENT_AMBULATORY_CARE_PROVIDER_SITE_OTHER): Payer: Medicare HMO | Admitting: Family

## 2021-05-06 VITALS — BP 149/82 | HR 83 | Resp 16 | Ht <= 58 in | Wt 111.0 lb

## 2021-05-06 DIAGNOSIS — I10 Essential (primary) hypertension: Secondary | ICD-10-CM | POA: Diagnosis not present

## 2021-05-06 DIAGNOSIS — E785 Hyperlipidemia, unspecified: Secondary | ICD-10-CM

## 2021-05-06 DIAGNOSIS — R748 Abnormal levels of other serum enzymes: Secondary | ICD-10-CM

## 2021-05-06 DIAGNOSIS — Z0000 Encounter for general adult medical examination without abnormal findings: Secondary | ICD-10-CM

## 2021-05-06 DIAGNOSIS — Z789 Other specified health status: Secondary | ICD-10-CM | POA: Diagnosis not present

## 2021-05-06 MED ORDER — ATORVASTATIN CALCIUM 20 MG PO TABS: 20.0000 mg | ORAL_TABLET | Freq: Every day | ORAL | 0 refills | Status: DC

## 2021-05-06 MED ORDER — LOSARTAN POTASSIUM-HCTZ 100-25 MG PO TABS: 1.0000 | ORAL_TABLET | Freq: Every day | ORAL | 0 refills | Status: DC

## 2021-05-06 NOTE — Progress Notes (Signed)
Patient ID: Yudit Modesitt, female    DOB: [birth year]  MRN: [id number]  CC: Hypertension Follow-Up   Subjective: Shannon [name] is a 70 y.o. female who presents for hypertension follow-up.   Her concerns today include:  Patient with recent Medicare Wellness Visit on 02/27/2021 with primary provider. Presents today for hypertension follow-up as most recent visit for hypertension follow-up was canceled by patient. Doing well on current regimen without issues and/or concerns. Still taking Atorvastatin and doing well on regimen.   Patient Active Problem List   Diagnosis Date Noted   Hypovitaminosis D 07/20/2019   Essential hypertension 07/20/2019   History of colonic polyps 07/20/2019   Chronic eczema of hand 07/20/2019   Age-related osteoporosis without current pathological fracture 12/03/2017   Chronic urticaria 12/03/2017   Pure hypercholesterolemia 12/03/2017     No current outpatient medications on file prior to visit.   No current facility-administered medications on file prior to visit.    No Known Allergies  Social History   Socioeconomic History   Marital status: Married    Spouse name: Not on file   Number of children: 4   Years of education: Not on file   Highest education level: Not on file  Occupational History   Not on file  Tobacco Use   Smoking status: Never   Smokeless tobacco: Never  Vaping Use   Vaping Use: Never used  Substance and Sexual Activity   Alcohol use: No   Drug use: No   Sexual activity: Yes  Other Topics Concern   Not on file  Social History Narrative   Not on file   Social Determinants of Health   Financial Resource Strain: Not on file  Food Insecurity: Not on file  Transportation Needs: Not on file  Physical Activity: Not on file  Stress: Not on file  Social Connections: Not on file  Intimate Partner Violence: Not on file    Family History  Problem Relation Age of Onset   Other Mother     Past Surgical  History:  Procedure Laterality Date   NO PAST SURGERIES      ROS: Review of Systems Negative except as stated above  PHYSICAL EXAM: BP (!) 149/82 (BP Location: Left Arm, Patient Position: Sitting, Cuff Size: Normal)   Pulse 83   Resp 16   Ht 4' 8.5" (1.435 m)   Wt 111 lb (50.3 kg)   SpO2 98%   BMI 24.45 kg/m   Physical Exam HENT:     Head: Normocephalic and atraumatic.  Eyes:     Extraocular Movements: Extraocular movements intact.     Conjunctiva/sclera: Conjunctivae normal.     Pupils: Pupils are equal, round, and reactive to light.  Cardiovascular:     Rate and Rhythm: Normal rate and regular rhythm.     Pulses: Normal pulses.     Heart sounds: Normal heart sounds.  Pulmonary:     Effort: Pulmonary effort is normal.     Breath sounds: Normal breath sounds.  Musculoskeletal:     Cervical back: Normal range of motion and neck supple.  Neurological:     General: No focal deficit present.     Mental Status: She is alert and oriented to person, place, and time.  Psychiatric:        Mood and Affect: Mood normal.        Behavior: Behavior normal.   ASSESSMENT AND PLAN: 1. Essential hypertension: - Blood pressure relative to goal.  - Continue Losartan-Hydrochlorothiazide  as prescribed.  - Counseled on blood pressure goal of less than 140/90, low-sodium, DASH diet, medication compliance, 150 minutes of moderate intensity exercise per week as tolerated. Discussed medication compliance, adverse effects. - Follow-up with primary provider in 3 months or sooner if needed.  - losartan-hydrochlorothiazide (HYZAAR) 100-25 MG tablet; Take 1 tablet by mouth daily.  Dispense: 90 tablet; Refill: 0  2. Hyperlipidemia, unspecified hyperlipidemia type: - Continue Atorvastatin as prescribed.  - Follow-up with primary provider as scheduled.  - atorvastatin (LIPITOR) 20 MG tablet; Take 1 tablet (20 mg total) by mouth daily.  Dispense: 90 tablet; Refill: 0  3. Elevated liver enzymes: -  Screening for liver enzymes.  - AST - ALT  4. Language barrier: - [facility] in-person interpreter participated during today's visit. Interpreter Name: Simmaly.    Patient was given the opportunity to ask questions.  Patient verbalized understanding of the plan and was able to repeat key elements of the plan. Patient was given clear instructions to go to Emergency Department or return to medical center if symptoms don't improve, worsen, or new problems develop.The patient verbalized understanding.   Orders Placed This Encounter  Procedures   AST   ALT    Requested Prescriptions   Signed Prescriptions Disp Refills   losartan-hydrochlorothiazide (HYZAAR) 100-25 MG tablet 90 tablet 0    Sig: Take 1 tablet by mouth daily.   atorvastatin (LIPITOR) 20 MG tablet 90 tablet 0    Sig: Take 1 tablet (20 mg total) by mouth daily.    Return in about 3 months (around 08/06/2021) for Follow-Up or next available hypertension .  Camillia Herter, NP

## 2021-05-07 ENCOUNTER — Other Ambulatory Visit: Payer: Self-pay | Admitting: Family

## 2021-05-07 DIAGNOSIS — R748 Abnormal levels of other serum enzymes: Secondary | ICD-10-CM

## 2021-05-07 LAB — AST: AST: 39 IU/L (ref 0–40)

## 2021-05-07 LAB — ALT: ALT: 58 IU/L — ABNORMAL HIGH (ref 0–32)

## 2021-05-07 NOTE — Progress Notes (Signed)
Please call patient with update.   AST, which is one enzyme that checks liver function back to normal.   ALT, another enzyme that checks liver function higher than normal but improved since 8 weeks ago.  Referral placed to Gastroenterology for further evaluation and management. Their office should call patient within 2 weeks with appointment details.

## 2021-06-05 ENCOUNTER — Encounter: Payer: Self-pay | Admitting: *Deleted

## 2021-06-11 ENCOUNTER — Encounter: Payer: Self-pay | Admitting: *Deleted

## 2021-06-25 ENCOUNTER — Telehealth: Payer: Self-pay | Admitting: Family

## 2021-06-25 NOTE — Telephone Encounter (Signed)
Pt's husband came in w/ a letter stating office has been trying to contact pt regarding results. Pt's husband confirmed [phone]  is a good number no interpreter needed. Pt is worried about results. Thank you

## 2021-06-27 NOTE — Telephone Encounter (Signed)
Spoke to pt spouse in regards to labs, gave info for [employer]

## 2021-06-28 ENCOUNTER — Encounter: Payer: Self-pay | Admitting: Gastroenterology

## 2021-07-31 NOTE — Progress Notes (Signed)
Patient ID: Shannon [name], female    DOB: [birth year]  MRN: [id number]  CC: Hypertension Follow-Up  Subjective: Shannon [name] is a 71 y.o. female who presents for hypertension follow-up.   Her concerns today include:  HYPERTENSION FOLLOW-UP: 02/27/2021: - Patient endorses she did not take blood pressure medication this morning because she thought this visit was fasting. - Continue Losartan-Hydrochlorothiazide as prescribed and to take dose once she returns home.   08/09/2021: Doing well on current regimen. No side effects. No issues/concerns. Denies chest pain and shortness of breath. Home blood pressures similar to today's reading.  2. HYPERLIPIDEMIA FOLLOW-UP: Doing well on current regimen, no issues/concerns.  Patient Active Problem List   Diagnosis Date Noted   Hypovitaminosis D 07/20/2019   Essential hypertension 07/20/2019   History of colonic polyps 07/20/2019   Chronic eczema of hand 07/20/2019   Age-related osteoporosis without current pathological fracture 12/03/2017   Chronic urticaria 12/03/2017   Pure hypercholesterolemia 12/03/2017     No current outpatient medications on file prior to visit.   No current facility-administered medications on file prior to visit.    No Known Allergies  Social History   Socioeconomic History   Marital status: Married    Spouse name: Not on file   Number of children: 4   Years of education: Not on file   Highest education level: Not on file  Occupational History   Not on file  Tobacco Use   Smoking status: Never   Smokeless tobacco: Never  Vaping Use   Vaping Use: Never used  Substance and Sexual Activity   Alcohol use: No   Drug use: No   Sexual activity: Yes  Other Topics Concern   Not on file  Social History Narrative   Not on file   Social Determinants of Health   Financial Resource Strain: Not on file  Food Insecurity: Not on file  Transportation Needs: Not on file  Physical Activity: Not on  file  Stress: Not on file  Social Connections: Not on file  Intimate Partner Violence: Not on file    Family History  Problem Relation Age of Onset   Other Mother     Past Surgical History:  Procedure Laterality Date   NO PAST SURGERIES      ROS: Review of Systems Negative except as stated above  PHYSICAL EXAM: BP 136/74 (BP Location: Left Arm, Patient Position: Sitting, Cuff Size: Normal)    Pulse 92    Temp 98.3 F (36.8 C)    Resp 18    Ht 4' 8.5" (1.435 m)    Wt 117 lb (53.1 kg)    SpO2 98%    BMI 25.77 kg/m   Physical Exam HENT:     Head: Normocephalic and atraumatic.  Eyes:     Extraocular Movements: Extraocular movements intact.     Conjunctiva/sclera: Conjunctivae normal.     Pupils: Pupils are equal, round, and reactive to light.  Cardiovascular:     Rate and Rhythm: Normal rate and regular rhythm.     Pulses: Normal pulses.     Heart sounds: Normal heart sounds.  Pulmonary:     Effort: Pulmonary effort is normal.     Breath sounds: Normal breath sounds.  Musculoskeletal:     Cervical back: Normal range of motion and neck supple.  Neurological:     General: No focal deficit present.     Mental Status: She is alert and oriented to person, place, and time.  Psychiatric:  Mood and Affect: Mood normal.        Behavior: Behavior normal.   ASSESSMENT AND PLAN: 1. Essential hypertension: - Continue Losartan-Hydrochlorothiazide as prescribed.  - Counseled on blood pressure goal of less than 140/90, low-sodium, DASH diet, medication compliance, 150 minutes of moderate intensity exercise per week as tolerated. Discussed medication compliance, adverse effects. - Update BMP.  - Follow-up with primary provider in 3 months or sooner if needed.  - Basic Metabolic Panel - losartan-hydrochlorothiazide (HYZAAR) 100-25 MG tablet; Take 1 tablet by mouth daily.  Dispense: 90 tablet; Refill: 0  2. Hyperlipidemia, unspecified hyperlipidemia type: - Practice low-fat  heart healthy diet and at least 150 minutes of moderate intensity exercise weekly as tolerated.  - Continue Atorvastatin as prescribed.  - Follow-up with primary provider as scheduled.  - atorvastatin (LIPITOR) 20 MG tablet; Take 1 tablet (20 mg total) by mouth daily.  Dispense: 120 tablet; Refill: 0  3. Language barrier: - Patient accompanied by husband, Shannon [name], who serves as interpreter and part-historian.   Patient was given the opportunity to ask questions.  Patient verbalized understanding of the plan and was able to repeat key elements of the plan. Patient was given clear instructions to go to Emergency Department or return to medical center if symptoms don't improve, worsen, or new problems develop.The patient verbalized understanding.   Orders Placed This Encounter  Procedures   Basic Metabolic Panel     Requested Prescriptions   Signed Prescriptions Disp Refills   losartan-hydrochlorothiazide (HYZAAR) 100-25 MG tablet 90 tablet 0    Sig: Take 1 tablet by mouth daily.   atorvastatin (LIPITOR) 20 MG tablet 120 tablet 0    Sig: Take 1 tablet (20 mg total) by mouth daily.    Return in about 3 months (around 11/07/2021) for Follow-Up or next available hypertension .  Camillia Herter, NP

## 2021-08-09 ENCOUNTER — Other Ambulatory Visit: Payer: Self-pay

## 2021-08-09 ENCOUNTER — Ambulatory Visit (INDEPENDENT_AMBULATORY_CARE_PROVIDER_SITE_OTHER): Payer: Medicare HMO | Admitting: Family

## 2021-08-09 ENCOUNTER — Encounter (INDEPENDENT_AMBULATORY_CARE_PROVIDER_SITE_OTHER): Payer: Self-pay

## 2021-08-09 VITALS — BP 136/74 | HR 92 | Temp 98.3°F | Resp 18 | Ht <= 58 in | Wt 117.0 lb

## 2021-08-09 DIAGNOSIS — Z789 Other specified health status: Secondary | ICD-10-CM

## 2021-08-09 DIAGNOSIS — E785 Hyperlipidemia, unspecified: Secondary | ICD-10-CM | POA: Diagnosis not present

## 2021-08-09 DIAGNOSIS — I10 Essential (primary) hypertension: Secondary | ICD-10-CM | POA: Diagnosis not present

## 2021-08-09 MED ORDER — ATORVASTATIN CALCIUM 20 MG PO TABS: 20.0000 mg | ORAL_TABLET | Freq: Every day | ORAL | 0 refills | Status: DC

## 2021-08-09 MED ORDER — LOSARTAN POTASSIUM-HCTZ 100-25 MG PO TABS: 1.0000 | ORAL_TABLET | Freq: Every day | ORAL | 0 refills | Status: DC

## 2021-08-09 NOTE — Patient Instructions (Signed)
Mediterranean Diet °A Mediterranean diet refers to food and lifestyle choices that are based on the traditions of countries located on the [place] Sea. It focuses on eating more fruits, vegetables, whole grains, beans, nuts, seeds, and heart-healthy fats, and eating less dairy, meat, eggs, and processed foods with added sugar, salt, and fat. This way of eating has been shown to help prevent certain conditions and improve outcomes for people who have chronic diseases, like kidney disease and heart disease. °What are tips for following this plan? °Reading food labels °Check the serving size of packaged foods. For foods such as rice and pasta, the serving size refers to the amount of cooked product, not dry. °Check the total fat in packaged foods. Avoid foods that have saturated fat or trans fats. °Check the ingredient list for added sugars, such as corn syrup. °Shopping ° °Buy a variety of foods that offer a balanced diet, including: °Fresh fruits and vegetables (produce). °Grains, beans, nuts, and seeds. Some of these may be available in unpackaged forms or large amounts (in bulk). °Fresh seafood. °Poultry and eggs. °Low-fat dairy products. °Buy whole ingredients instead of prepackaged foods. °Buy fresh fruits and vegetables in-season from local farmers markets. °Buy plain frozen fruits and vegetables. °If you do not have access to quality fresh seafood, buy precooked frozen shrimp or canned fish, such as tuna, salmon, or sardines. °Stock your pantry so you always have certain foods on hand, such as olive oil, canned tuna, canned tomatoes, rice, pasta, and beans. °Cooking °Cook foods with extra-virgin olive oil instead of using butter or other vegetable oils. °Have meat as a side dish, and have vegetables or grains as your main dish. This means having meat in small portions or adding small amounts of meat to foods like pasta or stew. °Use beans or vegetables instead of meat in common dishes like chili or  lasagna. °Experiment with different cooking methods. Try roasting, broiling, steaming, and sautéing vegetables. °Add frozen vegetables to soups, stews, pasta, or rice. °Add nuts or seeds for added healthy fats and plant protein at each meal. You can add these to yogurt, salads, or vegetable dishes. °Marinate fish or vegetables using olive oil, lemon juice, garlic, and fresh herbs. °Meal planning °Plan to eat one vegetarian meal one day each week. Try to work up to two vegetarian meals, if possible. °Eat seafood two or more times a week. °Have healthy snacks readily available, such as: °Vegetable sticks with hummus. °Greek yogurt. °Fruit and nut trail mix. °Eat balanced meals throughout the week. This includes: °Fruit: 2-3 servings a day. °Vegetables: 4-5 servings a day. °Low-fat dairy: 2 servings a day. °Fish, poultry, or lean meat: 1 serving a day. °Beans and legumes: 2 or more servings a week. °Nuts and seeds: 1-2 servings a day. °Whole grains: 6-8 servings a day. °Extra-virgin olive oil: 3-4 servings a day. °Limit red meat and sweets to only a few servings a month. °Lifestyle ° °Cook and eat meals together with your family, when possible. °Drink enough fluid to keep your urine pale yellow. °Be physically active every day. This includes: °Aerobic exercise like running or swimming. °Leisure activities like gardening, walking, or housework. °Get 7-8 hours of sleep each night. °If recommended by your health care provider, drink red wine in moderation. This means 1 glass a day for nonpregnant women and 2 glasses a day for men. A glass of wine equals 5 oz (150 mL). °What foods should I eat? °Fruits °Apples. Apricots. Avocado. Berries. Bananas. Cherries. Dates.   Figs. Grapes. Lemons. Melon. Oranges. Peaches. Plums. Pomegranate. °Vegetables °Artichokes. Beets. Broccoli. Cabbage. Carrots. Eggplant. Green beans. Chard. Kale. Spinach. Onions. Leeks. Peas. Squash. Tomatoes. Peppers. Radishes. °Grains °Whole-grain pasta. Brown  rice. Bulgur wheat. Polenta. Couscous. Whole-wheat bread. Oatmeal. Quinoa. °Meats and other proteins °Beans. Almonds. Sunflower seeds. Pine nuts. Peanuts. Cod. Salmon. Scallops. Shrimp. Tuna. Tilapia. Clams. Oysters. Eggs. Poultry without skin. °Dairy °Low-fat milk. Cheese. Greek yogurt. °Fats and oils °Extra-virgin olive oil. Avocado oil. Grapeseed oil. °Beverages °Water. Red wine. Herbal tea. °Sweets and desserts °Greek yogurt with honey. Baked apples. Poached pears. Trail mix. °Seasonings and condiments °Basil. Cilantro. Coriander. Cumin. Mint. Parsley. Sage. Rosemary. Tarragon. Garlic. Oregano. Thyme. Pepper. Balsamic vinegar. Tahini. Hummus. Tomato sauce. Olives. Mushrooms. °The items listed above may not be a complete list of foods and beverages you can eat. Contact a dietitian for more information. °What foods should I limit? °This is a list of foods that should be eaten rarely or only on special occasions. °Fruits °Fruit canned in syrup. °Vegetables °Deep-fried potatoes (french fries). °Grains °Prepackaged pasta or rice dishes. Prepackaged cereal with added sugar. Prepackaged snacks with added sugar. °Meats and other proteins °Beef. Pork. Lamb. Poultry with skin. Hot dogs. Bacon. °Dairy °Ice cream. Sour cream. Whole milk. °Fats and oils °Butter. Canola oil. Vegetable oil. Beef fat (tallow). Lard. °Beverages °Juice. Sugar-sweetened soft drinks. Beer. Liquor and spirits. °Sweets and desserts °Cookies. Cakes. Pies. Candy. °Seasonings and condiments °Mayonnaise. Pre-made sauces and marinades. °The items listed above may not be a complete list of foods and beverages you should limit. Contact a dietitian for more information. °Summary °The Mediterranean diet includes both food and lifestyle choices. °Eat a variety of fresh fruits and vegetables, beans, nuts, seeds, and whole grains. °Limit the amount of red meat and sweets that you eat. °If recommended by your health care provider, drink red wine in moderation.  This means 1 glass a day for nonpregnant women and 2 glasses a day for men. A glass of wine equals 5 oz (150 mL). °This information is not intended to replace advice given to you by your health care provider. Make sure you discuss any questions you have with your health care provider. °Document Revised: 08/19/2019 Document Reviewed: 06/16/2019 °Elsevier Patient Education © 2022 [employer]. ° °

## 2021-08-09 NOTE — Progress Notes (Signed)
Pt presents for hypertension follow-up accompanied by spouse Khume, needs medication refills

## 2021-08-10 LAB — BASIC METABOLIC PANEL
BUN/Creatinine Ratio: 16 (ref 12–28)
BUN: 10 mg/dL (ref 8–27)
CO2: 25 mmol/L (ref 20–29)
Calcium: 9 mg/dL (ref 8.7–10.3)
Chloride: 95 mmol/L — ABNORMAL LOW (ref 96–106)
Creatinine, Ser: 0.64 mg/dL (ref 0.57–1.00)
Glucose: 84 mg/dL (ref 70–99)
Potassium: 3.9 mmol/L (ref 3.5–5.2)
Sodium: 135 mmol/L (ref 134–144)
eGFR: 94 mL/min/{1.73_m2} (ref >59)

## 2021-08-10 NOTE — Progress Notes (Signed)
Please call patient with update.   Kidney function normal.

## 2021-08-12 ENCOUNTER — Encounter: Payer: Self-pay | Admitting: Gastroenterology

## 2021-08-12 ENCOUNTER — Other Ambulatory Visit (INDEPENDENT_AMBULATORY_CARE_PROVIDER_SITE_OTHER): Payer: Medicare HMO

## 2021-08-12 ENCOUNTER — Ambulatory Visit: Payer: Medicare HMO | Admitting: Gastroenterology

## 2021-08-12 VITALS — BP 120/78 | HR 88 | Ht <= 58 in | Wt 119.0 lb

## 2021-08-12 DIAGNOSIS — R7989 Other specified abnormal findings of blood chemistry: Secondary | ICD-10-CM | POA: Diagnosis not present

## 2021-08-12 DIAGNOSIS — Z86010 Personal history of colonic polyps: Secondary | ICD-10-CM | POA: Diagnosis not present

## 2021-08-12 LAB — HEPATIC FUNCTION PANEL
ALT: 49 U/L — ABNORMAL HIGH (ref 0–35)
AST: 34 U/L (ref 0–37)
Albumin: 4.4 g/dL (ref 3.5–5.2)
Alkaline Phosphatase: 116 U/L (ref 39–117)
Bilirubin, Direct: 0.1 mg/dL (ref 0.0–0.3)
Total Bilirubin: 0.6 mg/dL (ref 0.2–1.2)
Total Protein: 8 g/dL (ref 6.0–8.3)

## 2021-08-12 MED ORDER — GOLYTELY 236 G PO SOLR: 4000.0000 mL | Freq: Once | ORAL | 0 refills | Status: AC

## 2021-08-12 NOTE — Patient Instructions (Signed)
Your provider has requested that you go to the basement level for lab work before leaving today. Press "B" on the elevator. The lab is located at the first door on the left as you exit the elevator.  You have been scheduled for a colonoscopy. Please follow written instructions given to you at your visit today.  Please pick up your prep supplies at the pharmacy within the next 1-3 days. If you use inhalers (even only as needed), please bring them with you on the day of your procedure.  Due to recent changes in healthcare laws, you may see the results of your imaging and laboratory studies on MyChart before your provider has had a chance to review them.  We understand that in some cases there may be results that are confusing or concerning to you. Not all laboratory results come back in the same time frame and the provider may be waiting for multiple results in order to interpret others.  Please give [place] 48 hours in order for your provider to thoroughly review all the results before contacting the office for clarification of your results.   The [facility] providers would like to encourage you to use [facility] to communicate with providers for non-urgent requests or questions.  Due to long hold times on the telephone, sending your provider a message by [facility] may be a faster and more efficient way to get a response.  Please allow 48 business hours for a response.  Please remember that this is for non-urgent requests.   Thank you for choosing me and [facility].  Pricilla Riffle. Dagoberto Ligas., MD., Marval Regal

## 2021-08-12 NOTE — Progress Notes (Signed)
History of Present Illness: This is a 71 year old female referred by Shannon Herter, NP for the evaluation of elevated LFTs.  She is accompanied by her husband who who provides all translation.  She she moved from [place] to the [place] in 1981.  She returned for a visit to [facility] in 2006.  No recent international travel.  AST 64, ALT 101 alk phos 227 in August 2022. Repeat AST 64, ALT 58 in October in 2022.  She has no gastrointestinal complaints. Denies weight loss, abdominal pain, constipation, diarrhea, change in stool caliber, melena, hematochezia, nausea, vomiting, dysphagia, reflux symptoms, chest pain.    No Known Allergies Outpatient Medications Prior to Visit  Medication Sig Dispense Refill   Ascorbic Acid (VITA-C PO) Take 1 tablet by mouth as needed.     atorvastatin (LIPITOR) 20 MG tablet Take 1 tablet (20 mg total) by mouth daily. 120 tablet 0   Cholecalciferol (VITAMIN D3 PO) Take 1 capsule by mouth as needed.     losartan-hydrochlorothiazide (HYZAAR) 100-25 MG tablet Take 1 tablet by mouth daily. 90 tablet 0   No facility-administered medications prior to visit.   Past Medical History:  Diagnosis Date   Chronic eczema of hand 07/20/2019   Essential hypertension 07/20/2019   Heat rash    with itching   Hyperlipidemia    Hypovitaminosis D 07/20/2019   Tubular adenoma of colon 01/2018   Past Surgical History:  Procedure Laterality Date   NO PAST SURGERIES     Social History   Socioeconomic History   Marital status: Married    Spouse name: Not on file   Number of children: 4   Years of education: Not on file   Highest education level: Not on file  Occupational History   Not on file  Tobacco Use   Smoking status: Never   Smokeless tobacco: Never  Vaping Use   Vaping Use: Never used  Substance and Sexual Activity   Alcohol use: No   Drug use: No   Sexual activity: Yes  Other Topics Concern   Not on file  Social History Narrative   Not on file   Social  Determinants of Health   Financial Resource Strain: Not on file  Food Insecurity: Not on file  Transportation Needs: Not on file  Physical Activity: Not on file  Stress: Not on file  Social Connections: Not on file   Family History  Problem Relation Age of Onset   Other Mother       Review of Systems: Pertinent positive and negative review of systems were noted in the above HPI section. All other review of systems were otherwise negative.   Physical Exam: General: Well developed, well nourished, no acute distress Head: Normocephalic and atraumatic Eyes: Sclerae anicteric, EOMI Ears: Normal auditory acuity Mouth: Not examined, mask on during Covid-19 pandemic Neck: Supple, no masses or thyromegaly Lungs: Clear throughout to auscultation Heart: Regular rate and rhythm; no murmurs, rubs or bruits Abdomen: Soft, non tender and non distended. No masses, hepatosplenomegaly or hernias noted. Normal Bowel sounds Rectal: Deferred colonoscopy  Musculoskeletal: Symmetrical with no gross deformities  Skin: No lesions on visible extremities Pulses:  Normal pulses noted Extremities: No clubbing, cyanosis, edema or deformities noted Neurological: Alert oriented x 4, grossly nonfocal Cervical Nodes:  No significant cervical adenopathy Inguinal Nodes: No significant inguinal adenopathy Psychological:  Alert and cooperative. Normal mood and affect   Assessment and Recommendations:  Elevated LFTs. Etiology not clear. R/O statin  related DILI, biliary disease, chronic hepatitis. Repeat LFTs today and if they remain abnormal send standard serologic work-up and schedule RUQ [place].  Personal history of adenomatous colon polyps. Surveillance recommended in July 2022. Schedule colonoscopy. The risks (including bleeding, perforation, infection, missed lesions, medication reactions and possible hospitalization or surgery if complications occur), benefits, and alternatives to colonoscopy with possible  biopsy and possible polypectomy were discussed with the patient and they consent to proceed.     cc: Shannon Herter, NP [address] [place],  [place] [address]

## 2021-08-20 ENCOUNTER — Other Ambulatory Visit: Payer: Self-pay

## 2021-08-20 DIAGNOSIS — R7989 Other specified abnormal findings of blood chemistry: Secondary | ICD-10-CM

## 2021-08-26 ENCOUNTER — Other Ambulatory Visit: Payer: Self-pay

## 2021-08-26 ENCOUNTER — Encounter: Payer: Medicare HMO | Admitting: Gastroenterology

## 2021-08-26 ENCOUNTER — Telehealth: Payer: Self-pay | Admitting: Gastroenterology

## 2021-08-26 DIAGNOSIS — R7989 Other specified abnormal findings of blood chemistry: Secondary | ICD-10-CM

## 2021-08-26 NOTE — Telephone Encounter (Signed)
Patient husband returned call. Best contact number [phone]

## 2021-08-26 NOTE — Telephone Encounter (Signed)
I reviewed the lab results and recommendations from Dr. Fuller Plan with the patient's husband.  He will have her come for labs this week.  He understands that they will be contacted directly with an appointment date and time for RUQ [place]

## 2021-08-27 ENCOUNTER — Other Ambulatory Visit (INDEPENDENT_AMBULATORY_CARE_PROVIDER_SITE_OTHER): Payer: Medicare HMO

## 2021-08-27 DIAGNOSIS — R7989 Other specified abnormal findings of blood chemistry: Secondary | ICD-10-CM

## 2021-08-27 LAB — PROTIME-INR
INR: 1 ratio (ref 0.8–1.0)
Prothrombin Time: 11.3 s (ref 9.6–13.1)

## 2021-08-27 LAB — IBC + FERRITIN
Ferritin: 181.3 ng/mL (ref 10.0–291.0)
Iron: 119 ug/dL (ref 42–145)
Saturation Ratios: 33.3 % (ref 20.0–50.0)
TIBC: 357 ug/dL (ref 250.0–450.0)
Transferrin: 255 mg/dL (ref 212.0–360.0)

## 2021-08-29 ENCOUNTER — Other Ambulatory Visit: Payer: Self-pay

## 2021-08-29 DIAGNOSIS — R7989 Other specified abnormal findings of blood chemistry: Secondary | ICD-10-CM

## 2021-08-31 LAB — ALPHA-1-ANTITRYPSIN: A-1 Antitrypsin, Ser: 144 mg/dL (ref 83–199)

## 2021-08-31 LAB — CERULOPLASMIN: Ceruloplasmin: 23 mg/dL (ref 18–53)

## 2021-08-31 LAB — ANGIOTENSIN CONVERTING ENZYME: Angiotensin-Converting Enzyme: 27 U/L (ref 9–67)

## 2021-08-31 LAB — TISSUE TRANSGLUTAMINASE, IGA: (tTG) Ab, IgA: <1 U/mL

## 2021-08-31 LAB — ANTI-SMOOTH MUSCLE ANTIBODY, IGG: Actin (Smooth Muscle) Antibody (IGG): <20 U (ref <20)

## 2021-08-31 LAB — IGA: Immunoglobulin A: 213 mg/dL (ref 70–320)

## 2021-08-31 LAB — ANTI-NUCLEAR AB-TITER (ANA TITER): ANA Titer 1: 1:40 {titer} — ABNORMAL HIGH

## 2021-08-31 LAB — HEPATITIS B SURFACE ANTIGEN: Hepatitis B Surface Ag: NONREACTIVE

## 2021-08-31 LAB — HEPATITIS B SURFACE ANTIBODY, QUANTITATIVE: Hep B S AB Quant (Post): <5 m[IU]/mL — ABNORMAL LOW (ref >=10)

## 2021-08-31 LAB — HEPATITIS C ANTIBODY
Hepatitis C Ab: NONREACTIVE
SIGNAL TO CUT-OFF: 0.04 (ref <1.00)

## 2021-08-31 LAB — ANA: Anti Nuclear Antibody (ANA): POSITIVE — AB

## 2021-08-31 LAB — MITOCHONDRIAL ANTIBODIES: Mitochondrial M2 Ab, IgG: <=20 U (ref <=20.0)

## 2021-08-31 LAB — HEPATITIS B CORE ANTIBODY, IGM: Hep B C IgM: NONREACTIVE

## 2021-09-02 ENCOUNTER — Telehealth: Payer: Self-pay

## 2021-09-02 NOTE — Telephone Encounter (Signed)
Schedulers & myself have been unable to reach this pt. No voicemail box set up. Will call again at a later time.

## 2021-09-12 ENCOUNTER — Other Ambulatory Visit: Payer: Self-pay

## 2021-09-12 ENCOUNTER — Ambulatory Visit (HOSPITAL_COMMUNITY)
Admission: RE | Admit: 2021-09-12 | Discharge: 2021-09-12 | Disposition: A | Discharge status: Home / Self Care | Payer: Medicare HMO | Source: Ambulatory Visit | Attending: Gastroenterology | Admitting: Gastroenterology

## 2021-09-12 DIAGNOSIS — R7989 Other specified abnormal findings of blood chemistry: Secondary | ICD-10-CM | POA: Diagnosis not present

## 2021-09-12 DIAGNOSIS — R945 Abnormal results of liver function studies: Secondary | ICD-10-CM | POA: Diagnosis not present

## 2021-09-13 ENCOUNTER — Encounter: Payer: Self-pay | Admitting: Gastroenterology

## 2021-09-19 ENCOUNTER — Ambulatory Visit (AMBULATORY_SURGERY_CENTER): Payer: Medicare HMO | Admitting: Gastroenterology

## 2021-09-19 ENCOUNTER — Encounter: Payer: Self-pay | Admitting: Gastroenterology

## 2021-09-19 ENCOUNTER — Other Ambulatory Visit: Payer: Self-pay

## 2021-09-19 VITALS — BP 103/49 | HR 75 | Temp 98.6°F | Resp 14 | Ht <= 58 in | Wt 119.0 lb

## 2021-09-19 DIAGNOSIS — Z86010 Personal history of colonic polyps: Secondary | ICD-10-CM

## 2021-09-19 DIAGNOSIS — D124 Benign neoplasm of descending colon: Secondary | ICD-10-CM | POA: Diagnosis not present

## 2021-09-19 DIAGNOSIS — E7800 Pure hypercholesterolemia, unspecified: Secondary | ICD-10-CM | POA: Diagnosis not present

## 2021-09-19 DIAGNOSIS — I10 Essential (primary) hypertension: Secondary | ICD-10-CM | POA: Diagnosis not present

## 2021-09-19 DIAGNOSIS — K639 Disease of intestine, unspecified: Secondary | ICD-10-CM | POA: Diagnosis not present

## 2021-09-19 DIAGNOSIS — K5989 Other specified functional intestinal disorders: Secondary | ICD-10-CM | POA: Diagnosis not present

## 2021-09-19 MED ORDER — SODIUM CHLORIDE 0.9 % IV SOLN: 500.0000 mL | Freq: Once | INTRAVENOUS | Status: DC

## 2021-09-19 NOTE — Progress Notes (Signed)
Report given to PACU, vss 

## 2021-09-19 NOTE — Progress Notes (Signed)
Pt's states no medical or surgical changes since previsit or office visit.   Help with interview via [place].

## 2021-09-19 NOTE — Patient Instructions (Signed)
YOU HAD AN ENDOSCOPIC PROCEDURE TODAY AT THE [facility]:   Refer to the procedure report that was given to you for any specific questions about what was found during the examination.  If the procedure report does not answer your questions, please call your gastroenterologist to clarify.  If you requested that your care partner not be given the details of your procedure findings, then the procedure report has been included in a sealed envelope for you to review at your convenience later.  YOU SHOULD EXPECT: Some feelings of bloating in the abdomen. Passage of more gas than usual.  Walking can help get rid of the air that was put into your GI tract during the procedure and reduce the bloating. If you had a lower endoscopy (such as a colonoscopy or flexible sigmoidoscopy) you may notice spotting of blood in your stool or on the toilet paper. If you underwent a bowel prep for your procedure, you may not have a normal bowel movement for a few days.  Please Note:  You might notice some irritation and congestion in your nose or some drainage.  This is from the oxygen used during your procedure.  There is no need for concern and it should clear up in a day or so.  SYMPTOMS TO REPORT IMMEDIATELY:   Following lower endoscopy (colonoscopy or flexible sigmoidoscopy):  Excessive amounts of blood in the stool  Significant tenderness or worsening of abdominal pains  Swelling of the abdomen that is new, acute  Fever of 100F or higher  For urgent or emergent issues, a gastroenterologist can be reached at any hour by calling [phone]. Do not use MyChart messaging for urgent concerns.    DIET:  We do recommend a small meal at first, but then you may proceed to your regular diet.  Drink plenty of fluids but you should avoid alcoholic beverages for 24 hours.  ACTIVITY:  You should plan to take it easy for the rest of today and you should NOT DRIVE or use heavy machinery until tomorrow (because  of the sedation medicines used during the test).    FOLLOW UP: Our staff will call the number listed on your records 48-72 hours following your procedure to check on you and address any questions or concerns that you may have regarding the information given to you following your procedure. If we do not reach you, we will leave a message.  We will attempt to reach you two times.  During this call, we will ask if you have developed any symptoms of COVID 19. If you develop any symptoms (ie: fever, flu-like symptoms, shortness of breath, cough etc.) before then, please call [phone].  If you test positive for Covid 19 in the 2 weeks post procedure, please call and report this information to us.    If any biopsies were taken you will be contacted by phone or by letter within the next 1-3 weeks.  Please call us at [phone] if you have not heard about the biopsies in 3 weeks.    SIGNATURES/CONFIDENTIALITY: You and/or your care partner have signed paperwork which will be entered into your electronic medical record.  These signatures attest to the fact that that the information above on your After Visit Summary has been reviewed and is understood.  Full responsibility of the confidentiality of this discharge information lies with you and/or your care-partner. 

## 2021-09-19 NOTE — Op Note (Signed)
[facility] Patient Name: Shannon [name] Procedure Date: 09/19/2021 1:57 PM MRN: [id number] Endoscopist: Ladene Artist , MD Age: 71 Referring MD:  Date of Birth: Jun 03, 1951 Gender: Female Account #: [id number] Procedure:                Colonoscopy Indications:              Surveillance: Personal history of adenomatous                            polyps on last colonoscopy > 3 years ago Medicines:                Monitored Anesthesia Care Procedure:                Pre-Anesthesia Assessment:                           - Prior to the procedure, a History and Physical                            was performed, and patient medications and                            allergies were reviewed. The patient's tolerance of                            previous anesthesia was also reviewed. The risks                            and benefits of the procedure and the sedation                            options and risks were discussed with the patient.                            All questions were answered, and informed consent                            was obtained. Prior Anticoagulants: The patient has                            taken no previous anticoagulant or antiplatelet                            agents. ASA Grade Assessment: II - A patient with                            mild systemic disease. After reviewing the risks                            and benefits, the patient was deemed in                            satisfactory condition to undergo the procedure.  After obtaining informed consent, the colonoscope                            was passed under direct vision. Throughout the                            procedure, the patient's blood pressure, pulse, and                            oxygen saturations were monitored continuously. The                            Colonoscope was introduced through the anus and                            advanced to the the  cecum, identified by                            appendiceal orifice and ileocecal valve. The                            ileocecal valve, appendiceal orifice, and rectum                            were photographed. The quality of the bowel                            preparation was good. The colonoscopy was performed                            without difficulty. The patient tolerated the                            procedure well. Scope In: 2:05:29 PM Scope Out: 2:18:27 PM Scope Withdrawal Time: 0 hours 10 minutes 29 seconds  Total Procedure Duration: 0 hours 12 minutes 58 seconds  Findings:                 The perianal and digital rectal examinations were                            normal.                           A 6 mm polyp was found in the descending colon. The                            polyp was sessile. The polyp was removed with a                            cold snare. Resection and retrieval were complete.                           Scattered small-mouthed diverticula were found in  the entire colon. Peri-diverticular erythema was                            seen in the sigmoid colon. Biospied.                           Internal hemorrhoids were found during                            retroflexion. The hemorrhoids were small and Grade                            I (internal hemorrhoids that do not prolapse).                           The exam was otherwise without abnormality on                            direct and retroflexion views. Complications:            No immediate complications. Estimated blood loss:                            None. Estimated Blood Loss:     Estimated blood loss: none. Impression:               - One 6 mm polyp in the descending colon, removed                            with a cold snare. Resected and retrieved.                           - Mild diverticulosis in the entire examined colon.                             Peri-diverticular erythema in the sigmoid colon was                            seen. Biospied.                           - Internal hemorrhoids.                           - The examination was otherwise normal on direct                            and retroflexion views. Recommendation:           - Repeat colonoscopy after studies are complete for                            surveillance based on pathology results.                           - Patient has a contact number available for  emergencies. The signs and symptoms of potential                            delayed complications were discussed with the                            patient. Return to normal activities tomorrow.                            Written discharge instructions were provided to the                            patient.                           - Resume previous diet.                           - Continue present medications.                           - Await pathology results. Ladene Artist, MD 09/19/2021 2:21:44 PM This report has been signed electronically.

## 2021-09-19 NOTE — Progress Notes (Signed)
History & Physical  Primary Care Physician:  Camillia Herter, NP Primary Gastroenterologist: Lucio Edward, MD  CHIEF COMPLAINT:  Personal history of colon polyps   HPI: Shannon [name] is a 71 y.o. female with a personal history of adenomatous colon polyps on colonoscopy in 2019 for surveillance colonoscopy.   Past Medical History:  Diagnosis Date   Chronic eczema of hand 07/20/2019   Essential hypertension 07/20/2019   Heat rash    with itching   Hyperlipidemia    Hypovitaminosis D 07/20/2019   Tubular adenoma of colon 01/2018    Past Surgical History:  Procedure Laterality Date   NO PAST SURGERIES      Prior to Admission medications   Medication Sig Start Date End Date Taking? Authorizing Provider  Ascorbic Acid (VITA-C PO) Take 1 tablet by mouth as needed.   Yes [provider]  atorvastatin (LIPITOR) 20 MG tablet Take 1 tablet (20 mg total) by mouth daily. 08/09/21 12/07/21 Yes Minette Brine, Amy J, NP  Cholecalciferol (VITAMIN D3 PO) Take 1 capsule by mouth as needed.   Yes [provider]  losartan-hydrochlorothiazide (HYZAAR) 100-25 MG tablet Take 1 tablet by mouth daily. 08/09/21 11/07/21 Yes Camillia Herter, NP    Current Outpatient Medications  Medication Sig Dispense Refill   Ascorbic Acid (VITA-C PO) Take 1 tablet by mouth as needed.     atorvastatin (LIPITOR) 20 MG tablet Take 1 tablet (20 mg total) by mouth daily. 120 tablet 0   Cholecalciferol (VITAMIN D3 PO) Take 1 capsule by mouth as needed.     losartan-hydrochlorothiazide (HYZAAR) 100-25 MG tablet Take 1 tablet by mouth daily. 90 tablet 0   Current Facility-Administered Medications  Medication Dose Route Frequency Provider Last Rate Last Admin   0.9 %  sodium chloride infusion  500 mL Intravenous Once Ladene Artist, MD        Allergies as of 09/19/2021   (No Known Allergies)    Family History  Problem Relation Age of Onset   Other Mother     Social History   Socioeconomic  History   Marital status: Married    Spouse name: Not on file   Number of children: 4   Years of education: Not on file   Highest education level: Not on file  Occupational History   Not on file  Tobacco Use   Smoking status: Never   Smokeless tobacco: Never  Vaping Use   Vaping Use: Never used  Substance and Sexual Activity   Alcohol use: No   Drug use: No   Sexual activity: Yes  Other Topics Concern   Not on file  Social History Narrative   Not on file   Social Determinants of Health   Financial Resource Strain: Not on file  Food Insecurity: Not on file  Transportation Needs: Not on file  Physical Activity: Not on file  Stress: Not on file  Social Connections: Not on file  Intimate Partner Violence: Not on file    Review of Systems:  All systems reviewed an negative except where noted in HPI.  Gen: Denies any fever, chills, sweats, anorexia, fatigue, weakness, malaise, weight loss, and sleep disorder CV: Denies chest pain, angina, palpitations, syncope, orthopnea, PND, peripheral edema, and claudication. Resp: Denies dyspnea at rest, dyspnea with exercise, cough, sputum, wheezing, coughing up blood, and pleurisy. GI: Denies vomiting blood, jaundice, and fecal incontinence.   Denies dysphagia or odynophagia. GU : Denies urinary burning, blood in urine, urinary frequency, urinary hesitancy,  nocturnal urination, and urinary incontinence. MS: Denies joint pain, limitation of movement, and swelling, stiffness, low back pain, extremity pain. Denies muscle weakness, cramps, atrophy.  Derm: Denies rash, itching, dry skin, hives, moles, warts, or unhealing ulcers.  Psych: Denies depression, anxiety, memory loss, suicidal ideation, hallucinations, paranoia, and confusion. Heme: Denies bruising, bleeding, and enlarged lymph nodes. Neuro:  Denies any headaches, dizziness, paresthesias. Endo:  Denies any problems with DM, thyroid, adrenal function.   Physical Exam: General:   Alert, well-developed, in NAD Head:  Normocephalic and atraumatic. Eyes:  Sclera clear, no icterus.   Conjunctiva pink. Ears:  Normal auditory acuity. Mouth:  No deformity or lesions.  Neck:  Supple; no masses . Lungs:  Clear throughout to auscultation.   No wheezes, crackles, or rhonchi. No acute distress. Heart:  Regular rate and rhythm; no murmurs. Abdomen:  Soft, nondistended, nontender. No masses, hepatomegaly. No obvious masses.  Normal bowel .    Rectal:  Deferred   Msk:  Symmetrical without gross deformities.. Pulses:  Normal pulses noted. Extremities:  Without edema. Neurologic:  Alert and  oriented x4;  grossly normal neurologically. Skin:  Intact without significant lesions or rashes. Cervical Nodes:  No significant cervical adenopathy. Psych:  Alert and cooperative. Normal mood and affect.   Impression / [name]:   Personal history of 3 adenomatous colon polyps on colonoscopy in 2019 for surveillance colonoscopy.  Shannon [name]. Shannon [name]  09/19/2021, 1:57 PM See Shea Evans, [facility], to contact our on call provider

## 2021-09-19 NOTE — Progress Notes (Signed)
Called to room to assist during endoscopic procedure.  Patient ID and intended procedure confirmed with present staff. Received instructions for my participation in the procedure from the performing physician.  

## 2021-09-23 ENCOUNTER — Telehealth: Payer: Self-pay | Admitting: *Deleted

## 2021-09-23 NOTE — Telephone Encounter (Signed)
Attempted f/u phone call. No answer. No voicemail set up. Unable to leave message.

## 2021-09-23 NOTE — Telephone Encounter (Signed)
Patients husband returned call, stated that she is doing well.

## 2021-09-25 ENCOUNTER — Encounter: Payer: Self-pay | Admitting: Gastroenterology

## 2021-09-30 IMAGING — MG MM DIGITAL SCREENING BILAT W/ TOMO AND CAD
6 of 10 series · 6 of 30 positions shown · non-contrast
Comparison: Previous exam(s).

CLINICAL DATA: Screening.

EXAM:
DIGITAL SCREENING BILATERAL MAMMOGRAM WITH TOMOSYNTHESIS AND CAD
TECHNIQUE: Bilateral screening digital craniocaudal and mediolateral oblique
mammograms were obtained. Bilateral screening digital breast
tomosynthesis was performed. The images were evaluated with
computer-aided detection.

[L CC synth-2D]
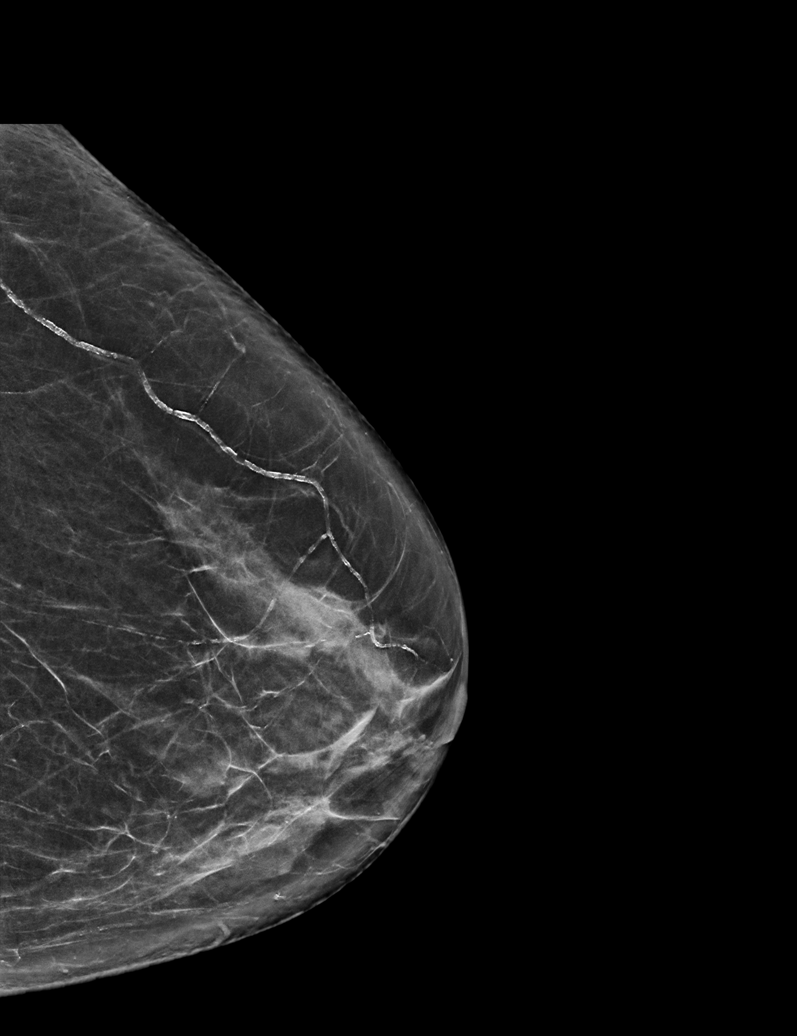

[L MLO synth-2D (1 of 2)]
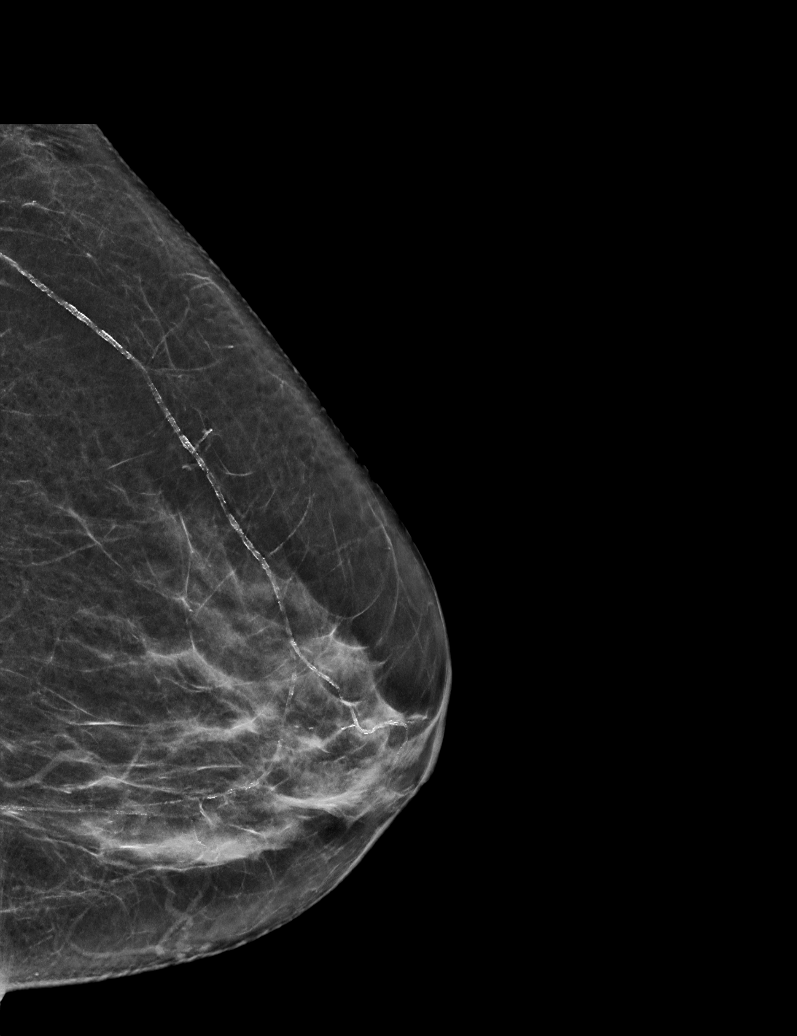

[R MLO synth-2D]
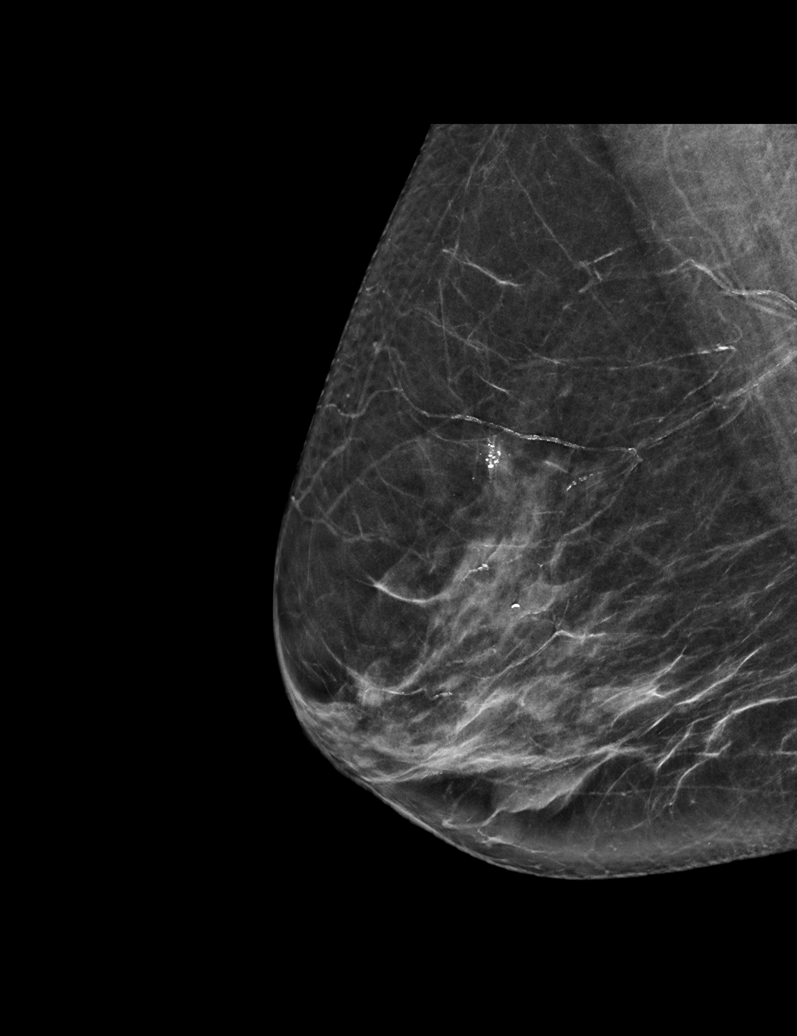

[R CC synth-2D]
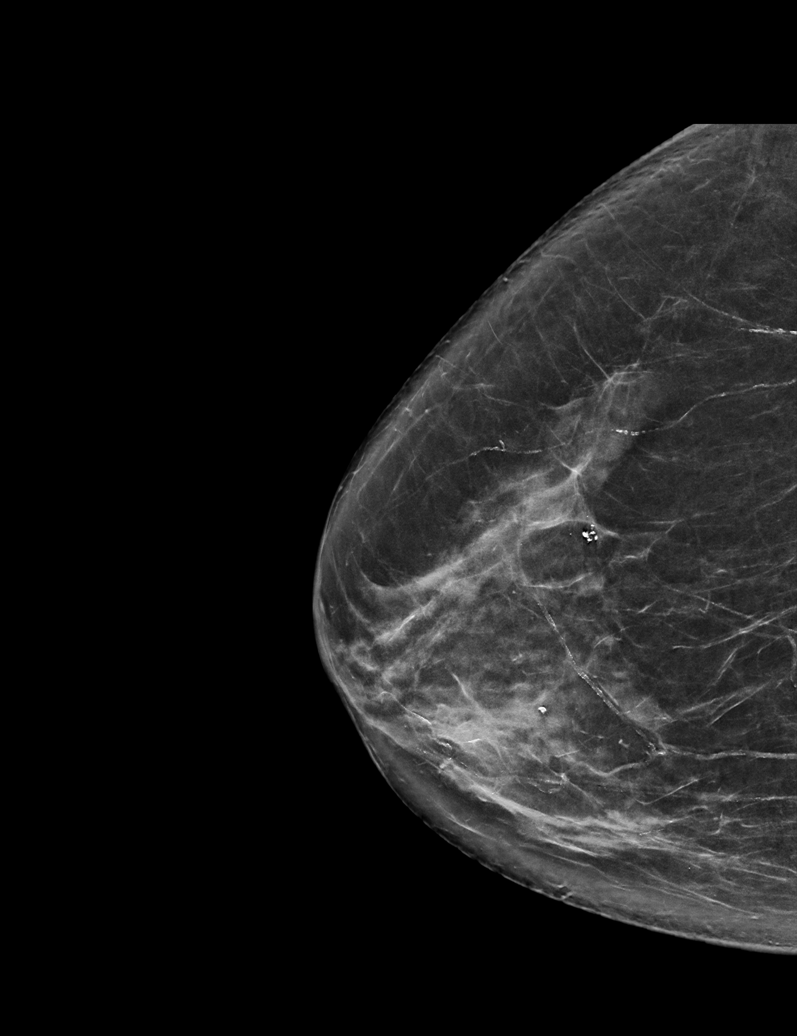

[L MLO synth-2D (2 of 2)]
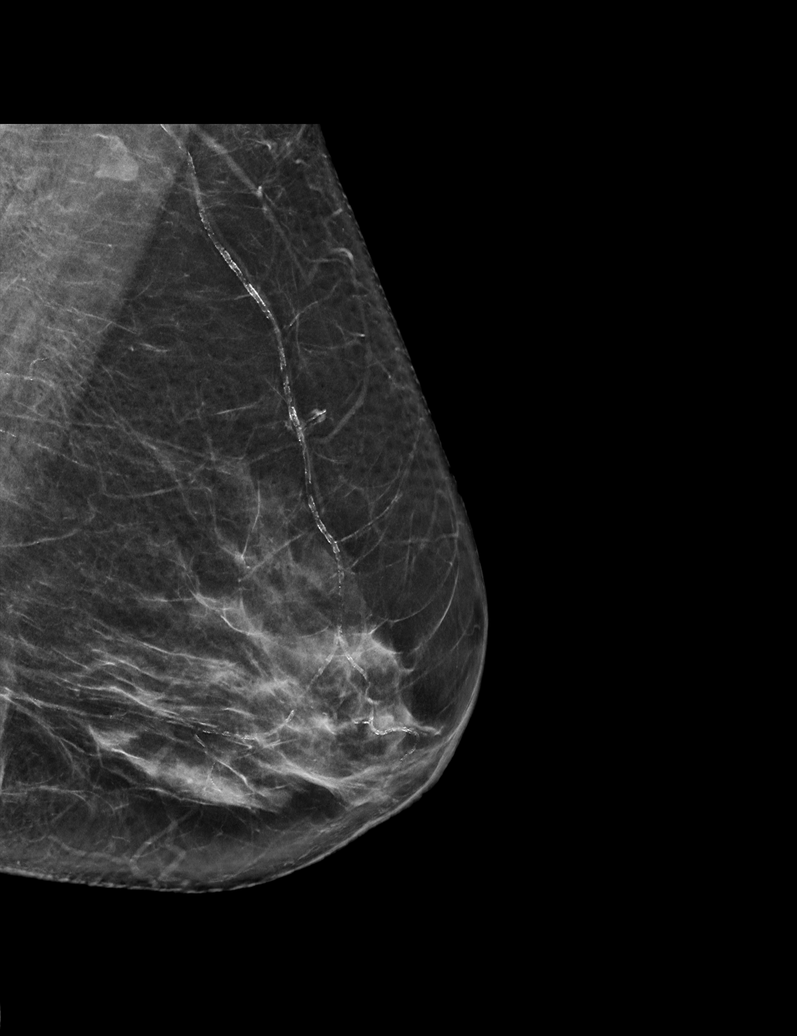

[L CC tomo · tomo slice 32/63.0]
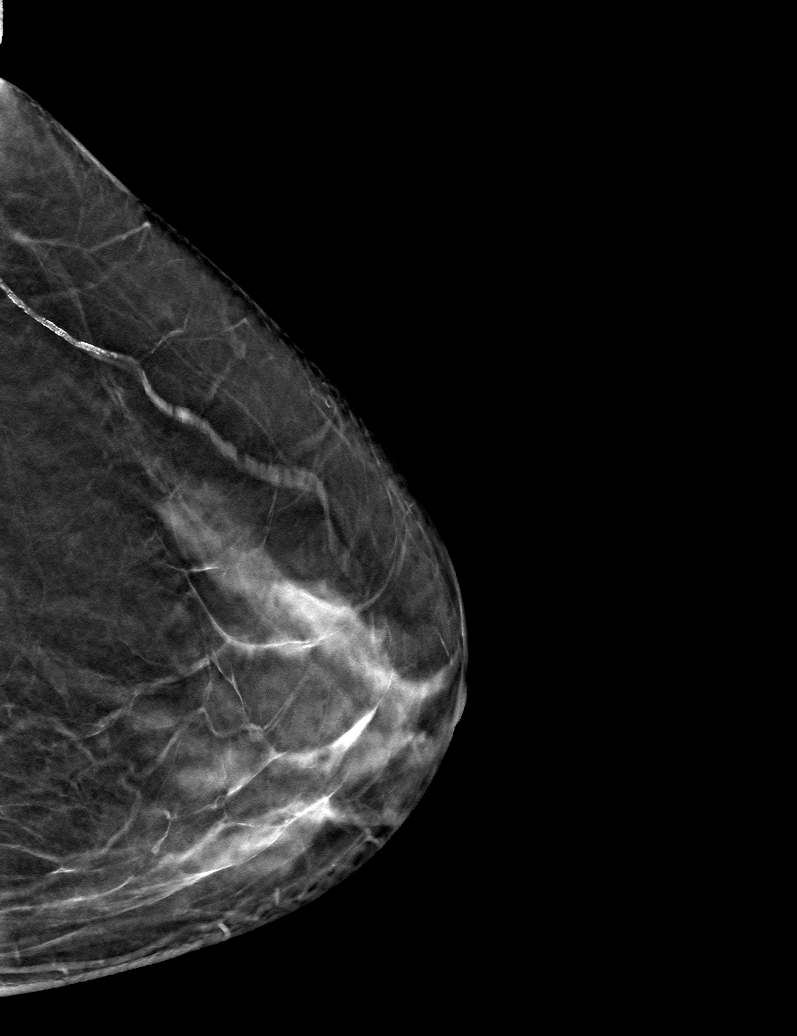

[6 of 30 positions shown; findings below may reference images not displayed]

ACR Breast Density Category c: The breast tissue is heterogeneously
dense, which may obscure small masses.
FINDINGS: There are no findings suspicious for malignancy. The images were
evaluated with computer-aided detection.
IMPRESSION: No mammographic evidence of malignancy. A result letter of this
screening mammogram will be mailed directly to the patient.

RECOMMENDATION:
Screening mammogram in one year. (Code:T4-5-GWO)

BI-RADS CATEGORY  1: Negative.

## 2021-10-28 NOTE — Progress Notes (Signed)
? ? ?Patient ID: Shannon [name], female    DOB: 04-Mar-1951  MRN: [id number] ? ?CC: Hypertension Follow-Up ? ?Subjective: ?Shannon [name] is a 71 y.o. female who presents for hypertension follow-up.  ? ?Her concerns today include:  ?HYPERTENSION FOLLOW-UP: ?08/09/2021: ?- Continue Losartan-Hydrochlorothiazide as prescribed.  ? ?10/31/2021: ?Doing well on current regimen. No side effects. No issues/concerns. Denies chest pain and shortness of breath.  ? ?2. HYPERLIPIDEMIA FOLLOW-UP: ?Doing well on Atorvastatin, no issues/concerns.   ? ?Patient Active Problem List  ? Diagnosis Date Noted  ? Hypovitaminosis D 07/20/2019  ? Essential hypertension 07/20/2019  ? History of colonic polyps 07/20/2019  ? Chronic eczema of hand 07/20/2019  ? Age-related osteoporosis without current pathological fracture 12/03/2017  ? Chronic urticaria 12/03/2017  ? Pure hypercholesterolemia 12/03/2017  ?  ? ?Current Outpatient Medications on File Prior to Visit  ?Medication Sig Dispense Refill  ? Ascorbic Acid (VITA-C PO) Take 1 tablet by mouth as needed.    ? Cholecalciferol (VITAMIN D3 PO) Take 1 capsule by mouth as needed.    ? ?No current facility-administered medications on file prior to visit.  ? ? ?No Known Allergies ? ?Social History  ? ?Socioeconomic History  ? Marital status: Married  ?  Spouse name: Not on file  ? Number of children: 4  ? Years of education: Not on file  ? Highest education level: Not on file  ?Occupational History  ? Not on file  ?Tobacco Use  ? Smoking status: Never  ? Smokeless tobacco: Never  ?Vaping Use  ? Vaping Use: Never used  ?Substance and Sexual Activity  ? Alcohol use: No  ? Drug use: No  ? Sexual activity: Yes  ?Other Topics Concern  ? Not on file  ?Social History Narrative  ? Not on file  ? ?Social Determinants of Health  ? ?Financial Resource Strain: Not on file  ?Food Insecurity: Not on file  ?Transportation Needs: Not on file  ?Physical Activity: Not on file  ?Stress: Not on file  ?Social  Connections: Not on file  ?Intimate Partner Violence: Not on file  ? ? ?Family History  ?Problem Relation Age of Onset  ? Other Mother   ? ? ?Past Surgical History:  ?Procedure Laterality Date  ? NO PAST SURGERIES    ? ? ?ROS: ?Review of Systems ?Negative except as stated above ? ?PHYSICAL EXAM: ?BP 129/83 (BP Location: Left Arm, Patient Position: Sitting, Cuff Size: Normal)   Pulse 80   Temp 98.3 ?F (36.8 ?C)   Resp 18   Ht 4' 8.5" (1.435 m)   Wt 118 lb (53.5 kg)   SpO2 98%   BMI 25.99 kg/m?  ? ?Physical Exam ?HENT:  ?   Head: Normocephalic and atraumatic.  ?Eyes:  ?   Extraocular Movements: Extraocular movements intact.  ?   Conjunctiva/sclera: Conjunctivae normal.  ?   Pupils: Pupils are equal, round, and reactive to light.  ?Cardiovascular:  ?   Rate and Rhythm: Normal rate and regular rhythm.  ?   Pulses: Normal pulses.  ?   Heart sounds: Normal heart sounds.  ?Pulmonary:  ?   Effort: Pulmonary effort is normal.  ?   Breath sounds: Normal breath sounds.  ?Musculoskeletal:  ?   Cervical back: Normal range of motion and neck supple.  ?Neurological:  ?   General: No focal deficit present.  ?   Mental Status: She is alert and oriented to person, place, and time.  ?Psychiatric:     ?  Mood and Affect: Mood normal.     ?   Behavior: Behavior normal.  ? ? ?ASSESSMENT AND PLAN: ?1. Essential (primary) hypertension: ?- Continue Losartan-Hydrochlorothiazide as prescribed.  ?- Counseled on blood pressure goal of less than 140/90, low-sodium, DASH diet, medication compliance, 150 minutes of moderate intensity exercise per week as tolerated. Discussed medication compliance, adverse effects. ?- Follow-up with primary provider in 4 months or sooner if needed.  ?- losartan-hydrochlorothiazide (HYZAAR) 100-25 MG tablet; Take 1 tablet by mouth daily.  Dispense: 120 tablet; Refill: 0 ? ?2. Hyperlipidemia, unspecified hyperlipidemia type: ?- Continue Atorvastatin as prescribed.  ?- Follow-up with primary provider as  scheduled.  ?- atorvastatin (LIPITOR) 20 MG tablet; Take 1 tablet (20 mg total) by mouth daily.  Dispense: 180 tablet; Refill: 0 ? ?3. Language barrier: ?- [place] in-person interpreter participated during today's visit. Interpreter Name: Simmaly.  ?  ? ?Patient was given the opportunity to ask questions.  Patient verbalized understanding of the plan and was able to repeat key elements of the plan. Patient was given clear instructions to go to Emergency Department or return to medical center if symptoms don't improve, worsen, or new problems develop.The patient verbalized understanding. ? ? ?Requested Prescriptions  ? ?Signed Prescriptions Disp Refills  ? losartan-hydrochlorothiazide (HYZAAR) 100-25 MG tablet 120 tablet 0  ?  Sig: Take 1 tablet by mouth daily.  ? atorvastatin (LIPITOR) 20 MG tablet 180 tablet 0  ?  Sig: Take 1 tablet (20 mg total) by mouth daily.  ? ? ?Return in about 4 months (around 03/02/2022) for Follow-Up or next available hypertension . ? ?Camillia Herter, NP  ?

## 2021-10-31 ENCOUNTER — Ambulatory Visit (INDEPENDENT_AMBULATORY_CARE_PROVIDER_SITE_OTHER): Payer: Medicare HMO | Admitting: Family

## 2021-10-31 VITALS — BP 129/83 | HR 80 | Temp 98.3°F | Resp 18 | Ht <= 58 in | Wt 118.0 lb

## 2021-10-31 DIAGNOSIS — E785 Hyperlipidemia, unspecified: Secondary | ICD-10-CM

## 2021-10-31 DIAGNOSIS — Z789 Other specified health status: Secondary | ICD-10-CM

## 2021-10-31 DIAGNOSIS — I10 Essential (primary) hypertension: Secondary | ICD-10-CM

## 2021-10-31 MED ORDER — ATORVASTATIN CALCIUM 20 MG PO TABS: 20.0000 mg | ORAL_TABLET | Freq: Every day | ORAL | 0 refills | Status: AC

## 2021-10-31 MED ORDER — LOSARTAN POTASSIUM-HCTZ 100-25 MG PO TABS: 1.0000 | ORAL_TABLET | Freq: Every day | ORAL | 0 refills | Status: DC

## 2021-10-31 NOTE — Progress Notes (Signed)
Pt presents for hypertension follow-up  ?

## 2021-11-01 ENCOUNTER — Ambulatory Visit: Payer: Medicare HMO | Admitting: Family

## 2021-11-18 ENCOUNTER — Telehealth: Payer: Self-pay

## 2021-11-18 NOTE — Telephone Encounter (Signed)
Called and spoke with patient's husband via interpreter services, and made him aware that patient needs to come in for lab work. He is aware that he can come in at anytime during the week between 7:30 am - 5:00 pm.  ?

## 2021-12-02 ENCOUNTER — Other Ambulatory Visit (INDEPENDENT_AMBULATORY_CARE_PROVIDER_SITE_OTHER): Payer: Medicare HMO

## 2021-12-02 DIAGNOSIS — R7989 Other specified abnormal findings of blood chemistry: Secondary | ICD-10-CM

## 2021-12-02 LAB — HEPATIC FUNCTION PANEL
ALT: 33 U/L (ref 0–35)
AST: 28 U/L (ref 0–37)
Albumin: 4.3 g/dL (ref 3.5–5.2)
Alkaline Phosphatase: 91 U/L (ref 39–117)
Bilirubin, Direct: 0.1 mg/dL (ref 0.0–0.3)
Total Bilirubin: 0.8 mg/dL (ref 0.2–1.2)
Total Protein: 7.4 g/dL (ref 6.0–8.3)

## 2022-02-26 ENCOUNTER — Ambulatory Visit (INDEPENDENT_AMBULATORY_CARE_PROVIDER_SITE_OTHER): Payer: Medicare HMO | Admitting: Family

## 2022-02-26 VITALS — BP 126/70 | HR 80 | Temp 98.3°F | Resp 18 | Ht <= 58 in | Wt 118.0 lb

## 2022-02-26 DIAGNOSIS — Z1231 Encounter for screening mammogram for malignant neoplasm of breast: Secondary | ICD-10-CM

## 2022-02-26 DIAGNOSIS — Z1329 Encounter for screening for other suspected endocrine disorder: Secondary | ICD-10-CM | POA: Diagnosis not present

## 2022-02-26 DIAGNOSIS — L309 Dermatitis, unspecified: Secondary | ICD-10-CM

## 2022-02-26 DIAGNOSIS — Z789 Other specified health status: Secondary | ICD-10-CM | POA: Diagnosis not present

## 2022-02-26 DIAGNOSIS — E785 Hyperlipidemia, unspecified: Secondary | ICD-10-CM | POA: Diagnosis not present

## 2022-02-26 DIAGNOSIS — Z0000 Encounter for general adult medical examination without abnormal findings: Secondary | ICD-10-CM

## 2022-02-26 DIAGNOSIS — R7303 Prediabetes: Secondary | ICD-10-CM | POA: Diagnosis not present

## 2022-02-26 DIAGNOSIS — I10 Essential (primary) hypertension: Secondary | ICD-10-CM | POA: Diagnosis not present

## 2022-02-26 MED ORDER — TRIAMCINOLONE ACETONIDE 0.5 % EX OINT: 1.0000 | TOPICAL_OINTMENT | Freq: Two times a day (BID) | CUTANEOUS | 1 refills | Status: AC

## 2022-02-26 MED ORDER — LOSARTAN POTASSIUM-HCTZ 100-25 MG PO TABS: 1.0000 | ORAL_TABLET | Freq: Every day | ORAL | 3 refills | Status: AC

## 2022-02-26 NOTE — Progress Notes (Signed)
Pt presents for annual physical exam  Pt states she needs refill on triamcinolone ointment has eczema flare on right wrist that recurrent every year around this season

## 2022-02-26 NOTE — Progress Notes (Signed)
Patient ID: Shannon [name], female    DOB: [birth year]  MRN: [id number]  CC: Annual Exam   Subjective: Shannon [name] is a 71 y.o. female who presents for annual exam.   Her concerns today include:  Doing well on chronic conditions medications without issues or concerns. Needs refill of Triamcinolone ointment for eczema flare.  Patient Active Problem List   Diagnosis Date Noted   Hypovitaminosis D 07/20/2019   Essential hypertension 07/20/2019   History of colonic polyps 07/20/2019   Chronic eczema of hand 07/20/2019   Age-related osteoporosis without current pathological fracture 12/03/2017   Chronic urticaria 12/03/2017   Pure hypercholesterolemia 12/03/2017     Current Outpatient Medications on File Prior to Visit  Medication Sig Dispense Refill   Ascorbic Acid (VITA-C PO) Take 1 tablet by mouth as needed.     atorvastatin (LIPITOR) 20 MG tablet Take 1 tablet (20 mg total) by mouth daily. 180 tablet 0   Cholecalciferol (VITAMIN D3 PO) Take 1 capsule by mouth as needed.     No current facility-administered medications on file prior to visit.    No Known Allergies  Social History   Socioeconomic History   Marital status: Married    Spouse name: Not on file   Number of children: 4   Years of education: Not on file   Highest education level: Not on file  Occupational History   Not on file  Tobacco Use   Smoking status: Never   Smokeless tobacco: Never  Vaping Use   Vaping Use: Never used  Substance and Sexual Activity   Alcohol use: No   Drug use: No   Sexual activity: Yes  Other Topics Concern   Not on file  Social History Narrative   Not on file   Social Determinants of Health   Financial Resource Strain: Not on file  Food Insecurity: Not on file  Transportation Needs: Not on file  Physical Activity: Not on file  Stress: Not on file  Social Connections: Not on file  Intimate Partner Violence: Not on file    Family History  Problem  Relation Age of Onset   Other Mother     Past Surgical History:  Procedure Laterality Date   NO PAST SURGERIES      ROS: Review of Systems Negative except as stated above  PHYSICAL EXAM: BP 126/70 (BP Location: Left Arm, Patient Position: Sitting, Cuff Size: Normal)   Pulse 80   Temp 98.3 F (36.8 C)   Resp 18   Ht 4' 8.5" (1.435 m)   Wt 118 lb (53.5 kg)   SpO2 96%   BMI 25.99 kg/m   Physical Exam HENT:     Head: Normocephalic and atraumatic.     Right Ear: Tympanic membrane, ear canal and external ear normal.     Left Ear: Tympanic membrane, ear canal and external ear normal.     Nose: Nose normal.     Mouth/Throat:     Mouth: Mucous membranes are moist.     Pharynx: Oropharynx is clear.  Eyes:     Extraocular Movements: Extraocular movements intact.     Conjunctiva/sclera: Conjunctivae normal.     Pupils: Pupils are equal, round, and reactive to light.  Cardiovascular:     Rate and Rhythm: Normal rate and regular rhythm.     Pulses: Normal pulses.     Heart sounds: Normal heart sounds.  Pulmonary:     Effort: Pulmonary effort is normal.  Breath sounds: Normal breath sounds.  Chest:     Comments: Patient declined.  Abdominal:     General: Bowel sounds are normal.     Palpations: Abdomen is soft.  Genitourinary:    Comments: Patient declined.  Musculoskeletal:        General: Normal range of motion.     Right shoulder: Normal.     Left shoulder: Normal.     Right upper arm: Normal.     Left upper arm: Normal.     Right elbow: Normal.     Left elbow: Normal.     Right forearm: Normal.     Left forearm: Normal.     Right wrist: Normal.     Left wrist: Normal.     Right hand: Normal.     Left hand: Normal.     Cervical back: Normal, normal range of motion and neck supple.     Thoracic back: Normal.     Lumbar back: Normal.     Right hip: Normal.     Left hip: Normal.     Right upper leg: Normal.     Left upper leg: Normal.     Right knee:  Normal.     Left knee: Normal.     Right lower leg: Normal.     Left lower leg: Normal.     Right ankle: Normal.     Left ankle: Normal.     Right foot: Normal.     Left foot: Normal.  Skin:    General: Skin is warm and dry.     Capillary Refill: Capillary refill takes less than 2 seconds.  Neurological:     General: No focal deficit present.     Mental Status: She is alert and oriented to person, place, and time.  Psychiatric:        Mood and Affect: Mood normal.        Behavior: Behavior normal.     ASSESSMENT AND PLAN: 1. Annual physical exam - Counseled on 150 minutes of exercise per week as tolerated, healthy eating (including decreased daily intake of saturated fats, cholesterol, added sugars, sodium), STI prevention, and routine healthcare maintenance.  2. Essential (primary) hypertension - Continue Losartan-Hydrochlorothiazide as prescribed.  - Counseled on blood pressure goal of less than 140/90, low-sodium, DASH diet, medication compliance, 150 minutes of moderate intensity exercise per week as tolerated. Discussed medication compliance, adverse effects. - Follow-up with primary provider in 4 months or sooner if needed.  - losartan-hydrochlorothiazide (HYZAAR) 100-25 MG tablet; Take 1 tablet by mouth daily.  Dispense: 30 tablet; Refill: 3  3. Hyperlipidemia, unspecified hyperlipidemia type - Update lipid panel. - Lipid Panel  4. Prediabetes - Update diabetes screening.  - Hemoglobin A1c  5. Thyroid disorder screen - TSH to check thyroid function.  - TSH  6. Encounter for screening mammogram for malignant neoplasm of breast - Referral for breast cancer screening by mammogram.  - MM Digital Screening; Future  7. Eczema, unspecified type - Triamcinolone ointment as prescribed.  - Follow-up with primary provider as scheduled.  - triamcinolone ointment (KENALOG) 0.5 %; Apply 1 Application topically 2 (two) times daily.  Dispense: 60 g; Refill: 1  8. Language  barrier - [place] in-person interpreter participated during today's visit. Interpreter Name: Simmaly.     Patient was given the opportunity to ask questions.  Patient verbalized understanding of the plan and was able to repeat key elements of the plan. Patient was given clear instructions to  go to Emergency Department or return to medical center if symptoms don't improve, worsen, or new problems develop.The patient verbalized understanding.   Orders Placed This Encounter  Procedures   MM Digital Screening   Lipid Panel   Hemoglobin A1c   TSH     Requested Prescriptions   Signed Prescriptions Disp Refills   triamcinolone ointment (KENALOG) 0.5 % 60 g 1    Sig: Apply 1 Application topically 2 (two) times daily.   losartan-hydrochlorothiazide (HYZAAR) 100-25 MG tablet 30 tablet 3    Sig: Take 1 tablet by mouth daily.    Return in about 1 year (around 02/27/2023) for Physical per patient preference and chronic care mgmt 4 months.  Rema Fendt, NP

## 2022-02-26 NOTE — Patient Instructions (Signed)
Preventive Care 65 Years and Older, Female Preventive care refers to lifestyle choices and visits with your health care provider that can promote health and wellness. Preventive care visits are also called wellness exams. What can I expect for my preventive care visit? Counseling Your health care provider may ask you questions about your: Medical history, including: Past medical problems. Family medical history. Pregnancy and menstrual history. History of falls. Current health, including: Memory and ability to understand (cognition). Emotional well-being. Home life and relationship well-being. Sexual activity and sexual health. Lifestyle, including: Alcohol, nicotine or tobacco, and drug use. Access to firearms. Diet, exercise, and sleep habits. Work and work environment. Sunscreen use. Safety issues such as seatbelt and bike helmet use. Physical exam Your health care provider will check your: Height and weight. These may be used to calculate your BMI (body mass index). BMI is a measurement that tells if you are at a healthy weight. Waist circumference. This measures the distance around your waistline. This measurement also tells if you are at a healthy weight and may help predict your risk of certain diseases, such as type 2 diabetes and high blood pressure. Heart rate and blood pressure. Body temperature. Skin for abnormal spots. What immunizations do I need?  Vaccines are usually given at various ages, according to a schedule. Your health care provider will recommend vaccines for you based on your age, medical history, and lifestyle or other factors, such as travel or where you work. What tests do I need? Screening Your health care provider may recommend screening tests for certain conditions. This may include: Lipid and cholesterol levels. Hepatitis C test. Hepatitis B test. HIV (human immunodeficiency virus) test. STI (sexually transmitted infection) testing, if you are at  risk. Lung cancer screening. Colorectal cancer screening. Diabetes screening. This is done by checking your blood sugar (glucose) after you have not eaten for a while (fasting). Mammogram. Talk with your health care provider about how often you should have regular mammograms. BRCA-related cancer screening. This may be done if you have a family history of breast, ovarian, tubal, or peritoneal cancers. Bone density scan. This is done to screen for osteoporosis. Talk with your health care provider about your test results, treatment options, and if necessary, the need for more tests. Follow these instructions at home: Eating and drinking  Eat a diet that includes fresh fruits and vegetables, whole grains, lean protein, and low-fat dairy products. Limit your intake of foods with high amounts of sugar, saturated fats, and salt. Take vitamin and mineral supplements as recommended by your health care provider. Do not drink alcohol if your health care provider tells you not to drink. If you drink alcohol: Limit how much you have to 0-1 drink a day. Know how much alcohol is in your drink. In the [place] one drink equals one 12 oz bottle of beer (355 mL), one 5 oz glass of wine (148 mL), or one 1 oz glass of hard liquor (44 mL). Lifestyle Brush your teeth every morning and night with fluoride toothpaste. Floss one time each day. Exercise for at least 30 minutes 5 or more days each week. Do not use any products that contain nicotine or tobacco. These products include cigarettes, chewing tobacco, and vaping devices, such as e-cigarettes. If you need help quitting, ask your health care provider. Do not use drugs. If you are sexually active, practice safe sex. Use a condom or other form of protection in order to prevent STIs. Take aspirin only as told by   your health care provider. Make sure that you understand how much to take and what form to take. Work with your health care provider to find out whether it  is safe and beneficial for you to take aspirin daily. Ask your health care provider if you need to take a cholesterol-lowering medicine (statin). Find healthy ways to manage stress, such as: Meditation, yoga, or listening to music. Journaling. Talking to a trusted person. Spending time with friends and family. Minimize exposure to UV radiation to reduce your risk of skin cancer. Safety Always wear your seat belt while driving or riding in a vehicle. Do not drive: If you have been drinking alcohol. Do not ride with someone who has been drinking. When you are tired or distracted. While texting. If you have been using any mind-altering substances or drugs. Wear a helmet and other protective equipment during sports activities. If you have firearms in your house, make sure you follow all gun safety procedures. What's next? Visit your health care provider once a year for an annual wellness visit. Ask your health care provider how often you should have your eyes and teeth checked. Stay up to date on all vaccines. This information is not intended to replace advice given to you by your health care provider. Make sure you discuss any questions you have with your health care provider. Document Revised: 01/09/2021 Document Reviewed: 01/09/2021 Elsevier Patient Education  2023 [employer]  

## 2022-02-27 LAB — LIPID PANEL
Chol/HDL Ratio: 2.2 ratio (ref 0.0–4.4)
Cholesterol, Total: 130 mg/dL (ref 100–199)
HDL: 58 mg/dL (ref >39)
LDL Chol Calc (NIH): 54 mg/dL (ref 0–99)
Triglycerides: 95 mg/dL (ref 0–149)
VLDL Cholesterol Cal: 18 mg/dL (ref 5–40)

## 2022-02-27 LAB — HEMOGLOBIN A1C
Est. average glucose Bld gHb Est-mCnc: 111 mg/dL
Hgb A1c MFr Bld: 5.5 % (ref 4.8–5.6)

## 2022-02-27 LAB — TSH: TSH: 1.99 u[IU]/mL (ref 0.450–4.500)

## 2022-03-18 ENCOUNTER — Ambulatory Visit
Admission: RE | Admit: 2022-03-18 | Discharge: 2022-03-18 | Disposition: A | Discharge status: Home / Self Care | Payer: Medicare HMO | Source: Ambulatory Visit | Attending: Family | Admitting: Family

## 2022-03-18 DIAGNOSIS — Z1231 Encounter for screening mammogram for malignant neoplasm of breast: Secondary | ICD-10-CM | POA: Diagnosis not present

## 2022-05-15 DIAGNOSIS — M810 Age-related osteoporosis without current pathological fracture: Secondary | ICD-10-CM | POA: Diagnosis not present

## 2022-05-15 DIAGNOSIS — E785 Hyperlipidemia, unspecified: Secondary | ICD-10-CM | POA: Diagnosis not present

## 2022-05-15 DIAGNOSIS — Z23 Encounter for immunization: Secondary | ICD-10-CM | POA: Diagnosis not present

## 2022-05-15 DIAGNOSIS — I10 Essential (primary) hypertension: Secondary | ICD-10-CM | POA: Diagnosis not present

## 2022-06-24 NOTE — Progress Notes (Deleted)
Patient ID: Shannon [name], female    DOB: 05-Jan-1951  MRN: [id number]  CC: Chronic Care Management   Subjective: Shannon [name] is a 71 y.o. female who presents for chronic care management.   Her concerns today include:  HTN - Losartan-HCTZ HLD - Atorvastatin  Patient Active Problem List   Diagnosis Date Noted   Hypovitaminosis D 07/20/2019   Essential hypertension 07/20/2019   History of colonic polyps 07/20/2019   Chronic eczema of hand 07/20/2019   Age-related osteoporosis without current pathological fracture 12/03/2017   Chronic urticaria 12/03/2017   Pure hypercholesterolemia 12/03/2017     Current Outpatient Medications on File Prior to Visit  Medication Sig Dispense Refill   Ascorbic Acid (VITA-C PO) Take 1 tablet by mouth as needed.     atorvastatin (LIPITOR) 20 MG tablet Take 1 tablet (20 mg total) by mouth daily. 180 tablet 0   Cholecalciferol (VITAMIN D3 PO) Take 1 capsule by mouth as needed.     losartan-hydrochlorothiazide (HYZAAR) 100-25 MG tablet Take 1 tablet by mouth daily. 30 tablet 3   triamcinolone ointment (KENALOG) 0.5 % Apply 1 Application topically 2 (two) times daily. 60 g 1   No current facility-administered medications on file prior to visit.    No Known Allergies  Social History   Socioeconomic History   Marital status: Married    Spouse name: Not on file   Number of children: 4   Years of education: Not on file   Highest education level: Not on file  Occupational History   Not on file  Tobacco Use   Smoking status: Never   Smokeless tobacco: Never  Vaping Use   Vaping Use: Never used  Substance and Sexual Activity   Alcohol use: No   Drug use: No   Sexual activity: Yes  Other Topics Concern   Not on file  Social History Narrative   Not on file   Social Determinants of Health   Financial Resource Strain: Not on file  Food Insecurity: Not on file  Transportation Needs: Not on file  Physical Activity: Not on file   Stress: Not on file  Social Connections: Not on file  Intimate Partner Violence: Not on file    Family History  Problem Relation Age of Onset   Other Mother     Past Surgical History:  Procedure Laterality Date   NO PAST SURGERIES      ROS: Review of Systems Negative except as stated above  PHYSICAL EXAM: There were no vitals taken for this visit.  Physical Exam  {female adult master:310786} {female adult master:310785}     Latest Ref Rng & Units 12/02/2021    9:41 AM 08/12/2021   10:03 AM 08/09/2021   11:42 AM  CMP  Glucose 70 - 99 mg/dL   84   BUN 8 - 27 mg/dL   10   Creatinine 0.57 - 1.00 mg/dL   0.64   Sodium 134 - 144 mmol/L   135   Potassium 3.5 - 5.2 mmol/L   3.9   Chloride 96 - 106 mmol/L   95   CO2 20 - 29 mmol/L   25   Calcium 8.7 - 10.3 mg/dL   9.0   Total Protein 6.0 - 8.3 g/dL 7.4  8.0    Total Bilirubin 0.2 - 1.2 mg/dL 0.8  0.6    Alkaline Phos 39 - 117 U/L 91  116    AST 0 - 37 U/L 28  34  ALT 0 - 35 U/L 33  49     Lipid Panel     Component Value Date/Time   CHOL 130 02/26/2022 1218   TRIG 95 02/26/2022 1218   HDL 58 02/26/2022 1218   CHOLHDL 2.2 02/26/2022 1218   CHOLHDL 5.3 (H) 11/06/2015 1439   VLDL 48 (H) 11/06/2015 1439   LDLCALC 54 02/26/2022 1218    CBC    Component Value Date/Time   WBC 8.1 02/27/2021 1124   WBC 9.2 11/06/2015 1439   RBC 4.30 02/27/2021 1124   RBC 4.56 11/06/2015 1439   HGB 11.9 02/27/2021 1124   HCT 36.1 02/27/2021 1124   PLT 259 02/27/2021 1124   MCV 84 02/27/2021 1124   MCH 27.7 02/27/2021 1124   MCH 27.2 11/06/2015 1439   MCHC 33.0 02/27/2021 1124   MCHC 33.2 11/06/2015 1439   RDW 13.0 02/27/2021 1124   LYMPHSABS 2.2 04/04/2020 1505   MONOABS 0.3 04/26/2010 0733   EOSABS 0.0 04/04/2020 1505   BASOSABS 0.0 04/04/2020 1505    ASSESSMENT AND PLAN:  There are no diagnoses linked to this encounter.   Patient was given the opportunity to ask questions.  Patient verbalized understanding of the  plan and was able to repeat key elements of the plan. Patient was given clear instructions to go to Emergency Department or return to medical center if symptoms don't improve, worsen, or new problems develop.The patient verbalized understanding.   No orders of the defined types were placed in this encounter.    Requested Prescriptions    No prescriptions requested or ordered in this encounter    No follow-ups on file.  Camillia Herter, NP

## 2022-06-27 ENCOUNTER — Ambulatory Visit: Payer: Medicare HMO | Admitting: Family

## 2022-06-27 DIAGNOSIS — I10 Essential (primary) hypertension: Secondary | ICD-10-CM

## 2022-06-27 DIAGNOSIS — E785 Hyperlipidemia, unspecified: Secondary | ICD-10-CM

## 2022-06-27 DIAGNOSIS — Z789 Other specified health status: Secondary | ICD-10-CM

## 2022-07-22 IMAGING — US US ABDOMEN LIMITED
1 series · 15 of 25 positions shown · non-contrast
Comparison: None.

CLINICAL DATA: Elevated LFTs.

EXAM:
ULTRASOUND ABDOMEN LIMITED RIGHT UPPER QUADRANT

[Series 1: us abdomen limited ruq mc & wl · 15 of 36 slices shown]
[im 1/36]
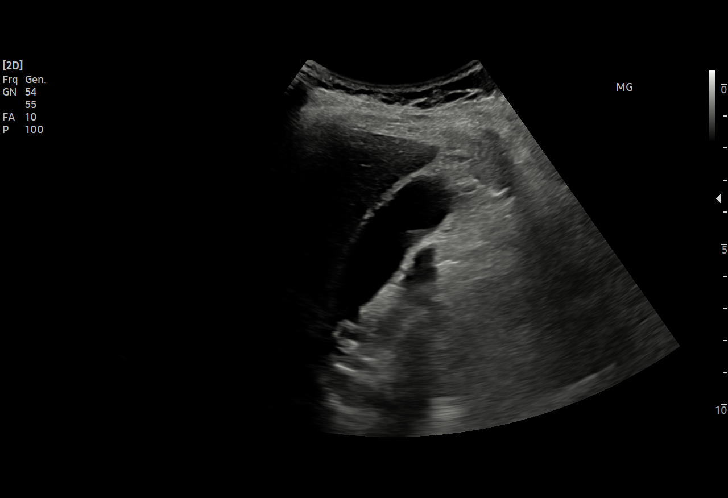
[im 3/36]
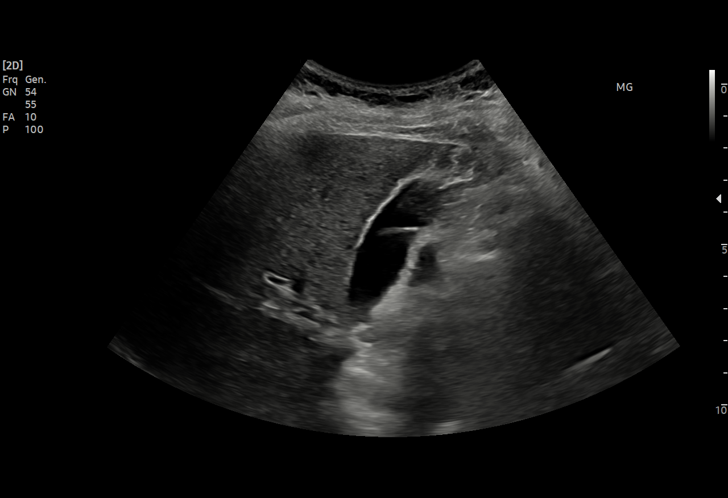
[im 6/36]
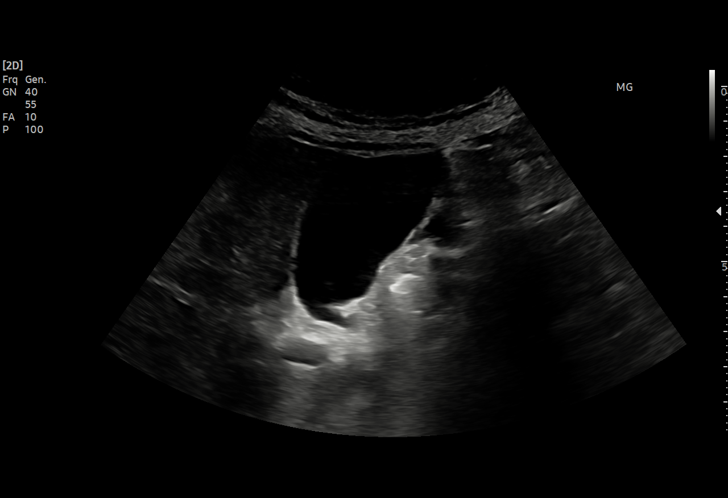
[im 8/36]
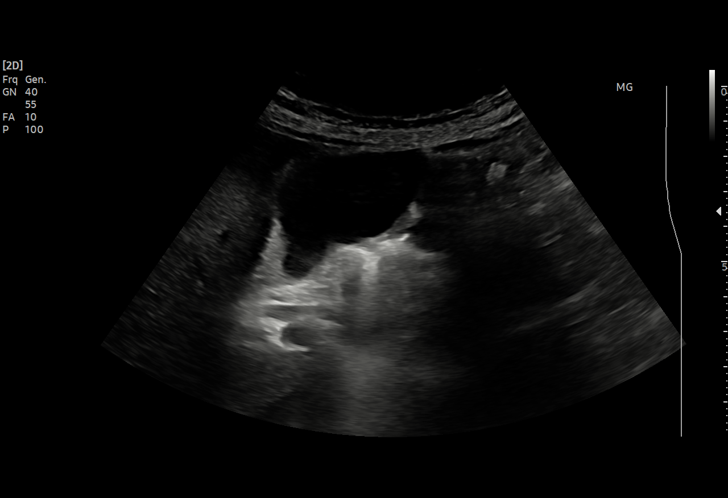
[im 11/36]
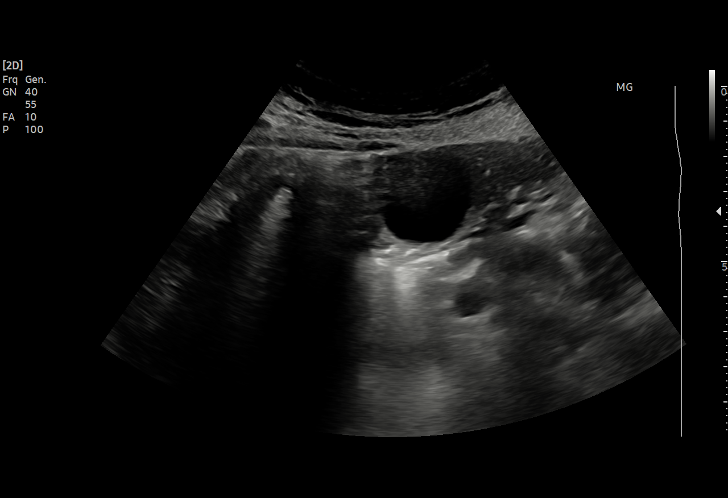
[im 14/36]
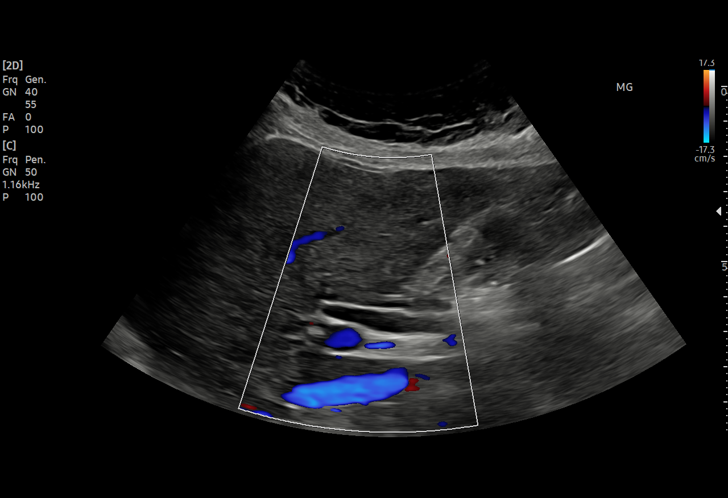
[im 15/36]
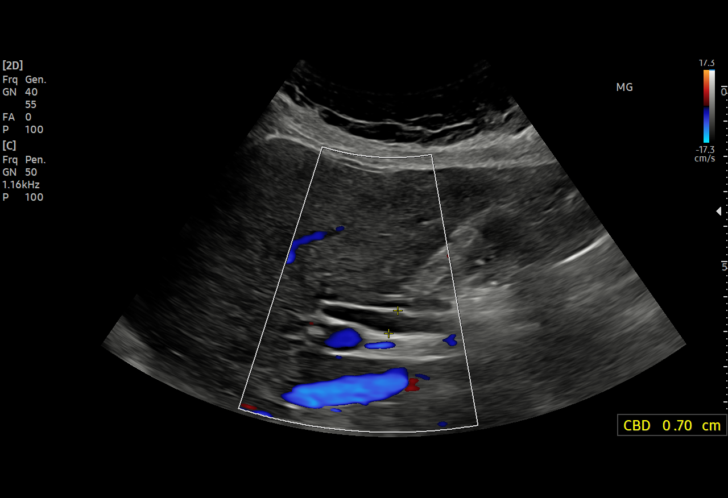
[im 18/36]
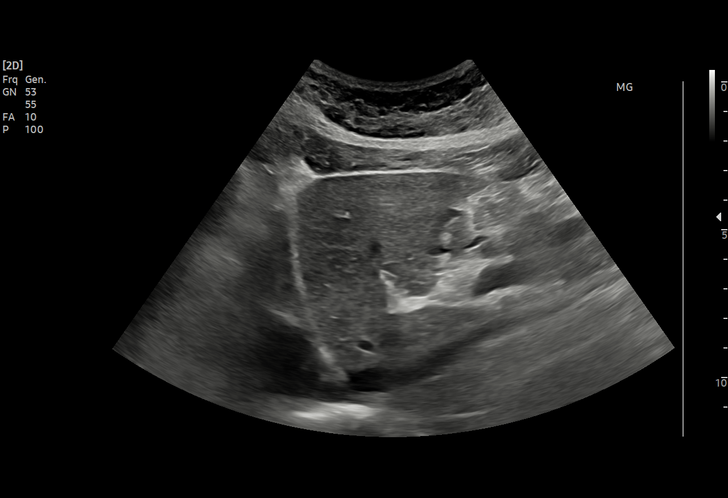
[im 21/36]
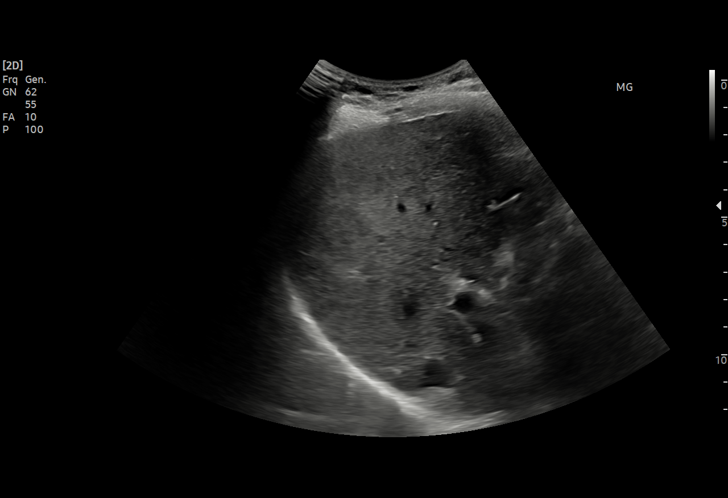
[im 22/36]
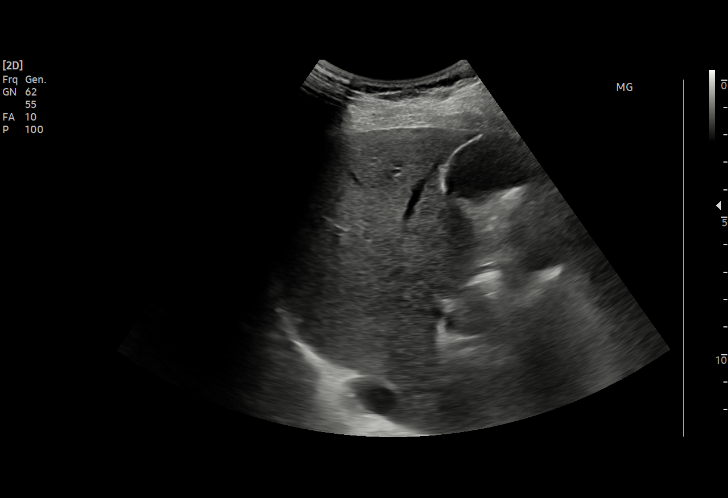
[im 25/36]
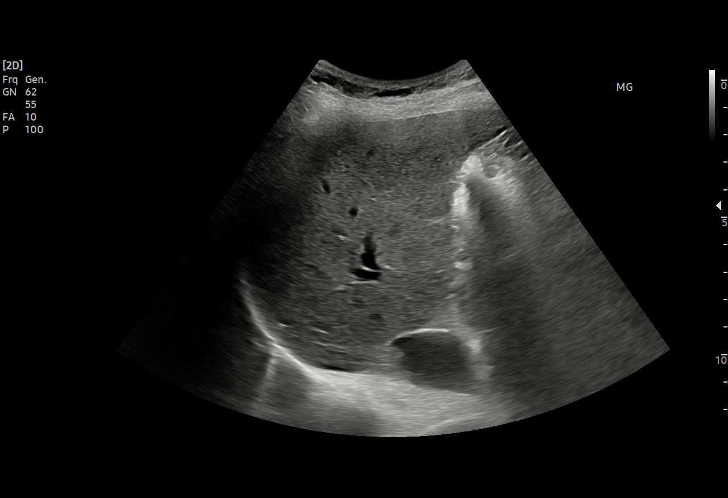
[im 28/36]
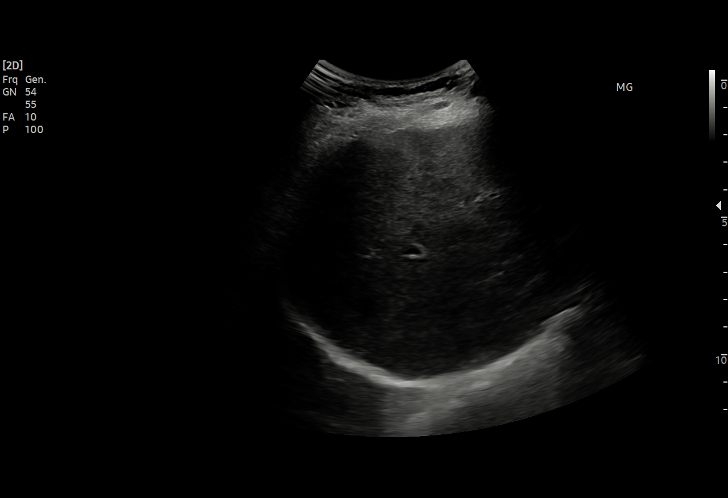
[im 30/36]
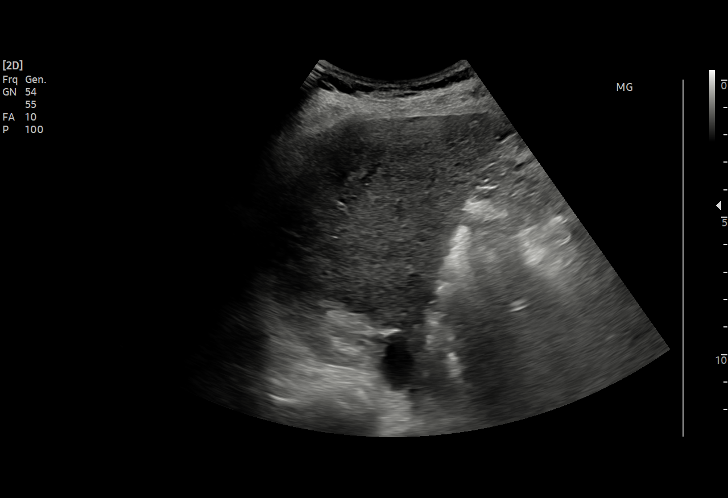
[im 33/36]
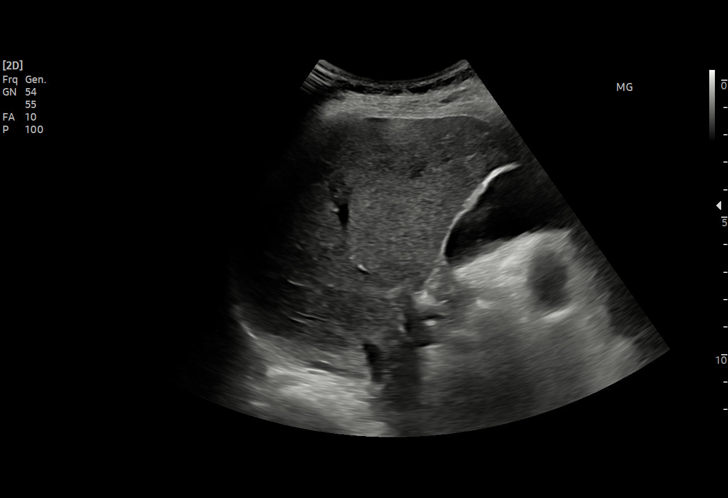
[im 36/36]
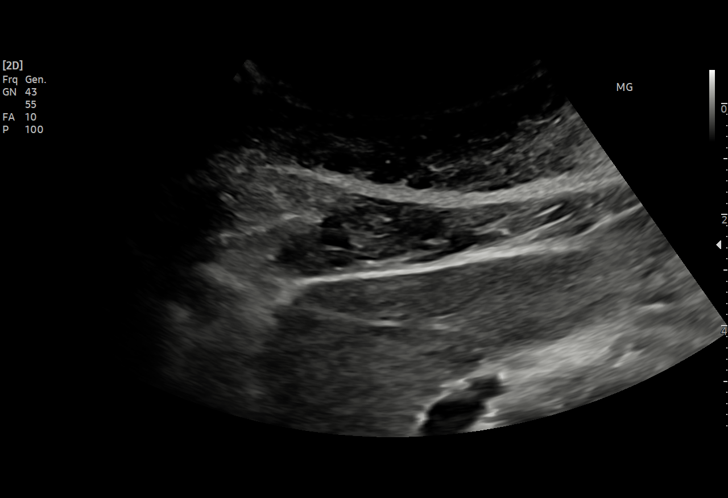

[15 of 25 positions shown; findings below may reference images not displayed]

FINDINGS: Gallbladder:

No gallstones or wall thickening visualized. No sonographic Murphy
sign noted by sonographer.

Common bile duct:

Diameter: 7 mm.

Liver:

Mild parenchymal heterogeneity without focal mass. Portal vein is
patent on color Doppler imaging with normal direction of blood flow
towards the liver.

Other: None.
IMPRESSION: No acute hepatobiliary findings.

## 2022-11-17 DIAGNOSIS — E785 Hyperlipidemia, unspecified: Secondary | ICD-10-CM | POA: Diagnosis not present

## 2022-11-17 DIAGNOSIS — M810 Age-related osteoporosis without current pathological fracture: Secondary | ICD-10-CM | POA: Diagnosis not present

## 2022-11-17 DIAGNOSIS — I10 Essential (primary) hypertension: Secondary | ICD-10-CM | POA: Diagnosis not present

## 2023-06-08 ENCOUNTER — Other Ambulatory Visit: Payer: Self-pay | Admitting: Family Medicine

## 2023-06-08 DIAGNOSIS — E785 Hyperlipidemia, unspecified: Secondary | ICD-10-CM | POA: Diagnosis not present

## 2023-06-08 DIAGNOSIS — Z1331 Encounter for screening for depression: Secondary | ICD-10-CM | POA: Diagnosis not present

## 2023-06-08 DIAGNOSIS — M810 Age-related osteoporosis without current pathological fracture: Secondary | ICD-10-CM | POA: Diagnosis not present

## 2023-06-08 DIAGNOSIS — Z0000 Encounter for general adult medical examination without abnormal findings: Secondary | ICD-10-CM | POA: Diagnosis not present

## 2023-06-08 DIAGNOSIS — Z1231 Encounter for screening mammogram for malignant neoplasm of breast: Secondary | ICD-10-CM

## 2023-06-08 DIAGNOSIS — E559 Vitamin D deficiency, unspecified: Secondary | ICD-10-CM | POA: Diagnosis not present

## 2023-06-08 DIAGNOSIS — I10 Essential (primary) hypertension: Secondary | ICD-10-CM | POA: Diagnosis not present

## 2023-06-08 DIAGNOSIS — Z23 Encounter for immunization: Secondary | ICD-10-CM | POA: Diagnosis not present

## 2023-07-09 ENCOUNTER — Ambulatory Visit
Admission: RE | Admit: 2023-07-09 | Discharge: 2023-07-09 | Disposition: A | Discharge status: Home / Self Care | Payer: Medicare HMO | Source: Ambulatory Visit | Attending: Family Medicine | Admitting: Family Medicine

## 2023-07-09 DIAGNOSIS — Z1231 Encounter for screening mammogram for malignant neoplasm of breast: Secondary | ICD-10-CM | POA: Diagnosis not present

## 2023-12-18 DIAGNOSIS — E559 Vitamin D deficiency, unspecified: Secondary | ICD-10-CM | POA: Diagnosis not present

## 2023-12-18 DIAGNOSIS — E785 Hyperlipidemia, unspecified: Secondary | ICD-10-CM | POA: Diagnosis not present

## 2023-12-18 DIAGNOSIS — I10 Essential (primary) hypertension: Secondary | ICD-10-CM | POA: Diagnosis not present

## 2024-07-11 DIAGNOSIS — M810 Age-related osteoporosis without current pathological fracture: Secondary | ICD-10-CM | POA: Diagnosis not present

## 2024-07-11 DIAGNOSIS — Z1231 Encounter for screening mammogram for malignant neoplasm of breast: Secondary | ICD-10-CM | POA: Diagnosis not present

## 2024-07-15 DIAGNOSIS — M810 Age-related osteoporosis without current pathological fracture: Secondary | ICD-10-CM | POA: Diagnosis not present

## 2024-07-15 DIAGNOSIS — E559 Vitamin D deficiency, unspecified: Secondary | ICD-10-CM | POA: Diagnosis not present

## 2024-07-15 DIAGNOSIS — L821 Other seborrheic keratosis: Secondary | ICD-10-CM | POA: Diagnosis not present

## 2024-07-15 DIAGNOSIS — Z0000 Encounter for general adult medical examination without abnormal findings: Secondary | ICD-10-CM | POA: Diagnosis not present

## 2024-07-15 DIAGNOSIS — E785 Hyperlipidemia, unspecified: Secondary | ICD-10-CM | POA: Diagnosis not present

## 2024-07-15 DIAGNOSIS — I10 Essential (primary) hypertension: Secondary | ICD-10-CM | POA: Diagnosis not present

## 2024-07-15 DIAGNOSIS — Z1331 Encounter for screening for depression: Secondary | ICD-10-CM | POA: Diagnosis not present

## 2024-07-15 DIAGNOSIS — Z23 Encounter for immunization: Secondary | ICD-10-CM | POA: Diagnosis not present
